# Patient Record
Sex: Female | Born: 1980 | Race: White | Hispanic: Yes | Marital: Married | Smoking: Former smoker
Health system: Southern US, Community
[De-identification: ages and names within clinical notes are randomized; demographics above are authoritative.]

## PROBLEM LIST (undated history)

## (undated) DIAGNOSIS — O24419 Gestational diabetes mellitus in pregnancy, unspecified control: Secondary | ICD-10-CM

## (undated) DIAGNOSIS — R519 Headache, unspecified: Secondary | ICD-10-CM

## (undated) DIAGNOSIS — I10 Essential (primary) hypertension: Secondary | ICD-10-CM

## (undated) DIAGNOSIS — F99 Mental disorder, not otherwise specified: Secondary | ICD-10-CM

## (undated) HISTORY — DX: Essential (primary) hypertension: I10

## (undated) HISTORY — DX: Gestational diabetes mellitus in pregnancy, unspecified control: O24.419

## (undated) HISTORY — DX: Headache, unspecified: R51.9

## (undated) HISTORY — DX: Mental disorder, not otherwise specified: F99

---

## 2009-03-30 DIAGNOSIS — R1904 Left lower quadrant abdominal swelling, mass and lump: Secondary | ICD-10-CM

## 2009-03-30 HISTORY — DX: Left lower quadrant abdominal swelling, mass and lump: R19.04

## 2011-10-13 LAB — OB RESULTS CONSOLE VARICELLA ZOSTER ANTIBODY, IGG
Varicella: IMMUNE
Varicella: IMMUNE
Varicella: IMMUNE

## 2011-10-13 LAB — OB RESULTS CONSOLE RUBELLA ANTIBODY, IGM
Rubella: IMMUNE
Rubella: IMMUNE
Rubella: IMMUNE

## 2011-10-13 LAB — SICKLE CELL SCREEN: Sickle Cell Screen: NORMAL

## 2011-10-15 ENCOUNTER — Emergency Department: Payer: Self-pay | Admitting: Emergency Medicine

## 2011-10-15 LAB — CBC
HCT: 36.8 % (ref 35.0–47.0)
HGB: 13.1 g/dL (ref 12.0–16.0)
MCH: 32.5 pg (ref 26.0–34.0)
MCHC: 35.7 g/dL (ref 32.0–36.0)
RDW: 13.3 % (ref 11.5–14.5)

## 2011-10-15 LAB — PREGNANCY, URINE: Pregnancy Test, Urine: POSITIVE m[IU]/mL

## 2011-10-15 LAB — COMPREHENSIVE METABOLIC PANEL
Albumin: 3.3 g/dL — ABNORMAL LOW (ref 3.4–5.0)
Alkaline Phosphatase: 56 U/L (ref 50–136)
BUN: 10 mg/dL (ref 7–18)
Bilirubin,Total: 0.3 mg/dL (ref 0.2–1.0)
Creatinine: 0.48 mg/dL — ABNORMAL LOW (ref 0.60–1.30)
EGFR (African American): >60
Osmolality: 274 (ref 275–301)
SGOT(AST): 22 U/L (ref 15–37)
Sodium: 138 mmol/L (ref 136–145)

## 2011-10-15 LAB — URINALYSIS, COMPLETE
Bacteria: NONE SEEN
Bilirubin,UR: NEGATIVE
Ketone: NEGATIVE
Leukocyte Esterase: NEGATIVE
Protein: NEGATIVE
RBC,UR: <1 /HPF (ref 0–5)
Specific Gravity: 1.015 (ref 1.003–1.030)
Squamous Epithelial: 1
WBC UR: 2 /HPF (ref 0–5)

## 2011-10-15 LAB — WET PREP, GENITAL

## 2011-10-15 LAB — LIPASE, BLOOD: Lipase: 131 U/L (ref 73–393)

## 2011-10-15 LAB — HCG, QUANTITATIVE, PREGNANCY: Beta Hcg, Quant.: 35390 m[IU]/mL — ABNORMAL HIGH

## 2011-10-16 IMAGING — US US OB < 14 WEEKS
1 series · 14 of 28 positions shown · non-contrast
Comparison: none

REASON FOR EXAM: Abdominal pain, vaginal bleeding
COMMENTS:   May transport without cardiac monitor

PROCEDURE:     US  - US OB LESS THAN 14 WEEKS  - October 16, 2011 [DATE]
RESULT:     Comparison: None.
TECHNIQUE: Multiple grayscale and color Doppler images were obtained of the
pelvis via transabdominal ultrasound. Endovaginal ultrasound was not
performed.

[Series 1: us ob < 14 weeks · 0.21mm/px · 14 of 29 slices shown]
[im 2/29]
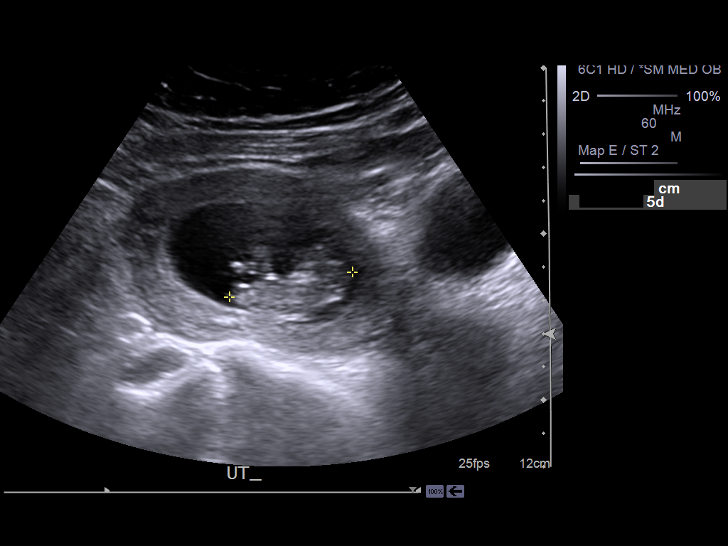
[im 4/29]
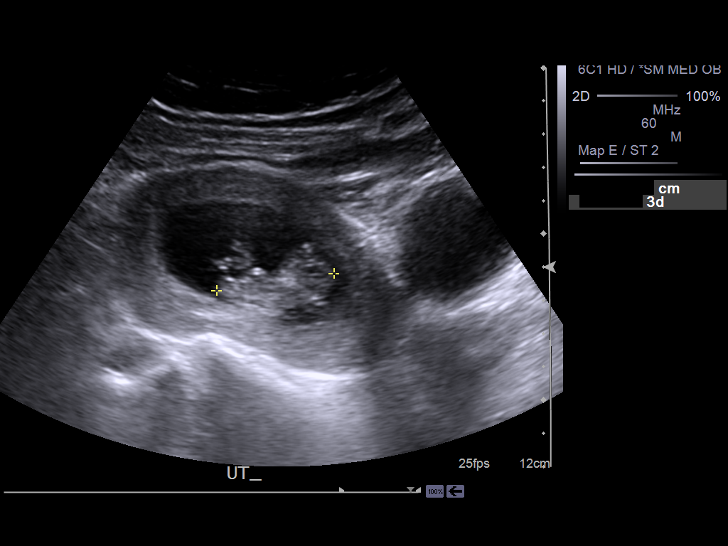
[im 6/29]
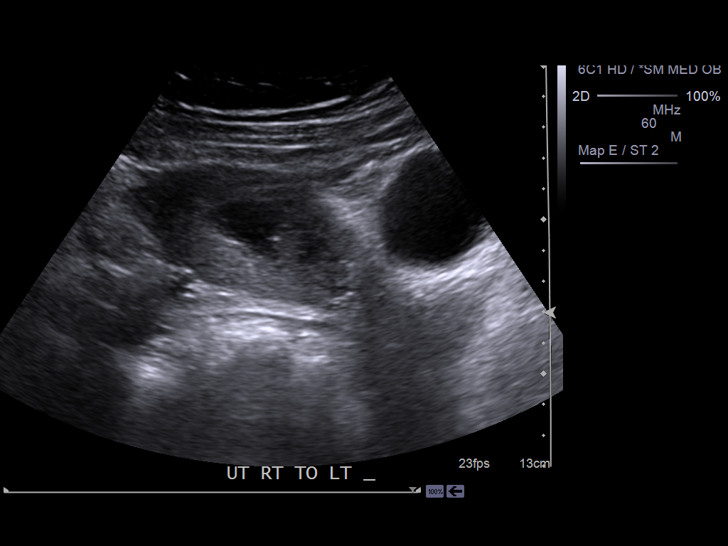
[im 8/29]
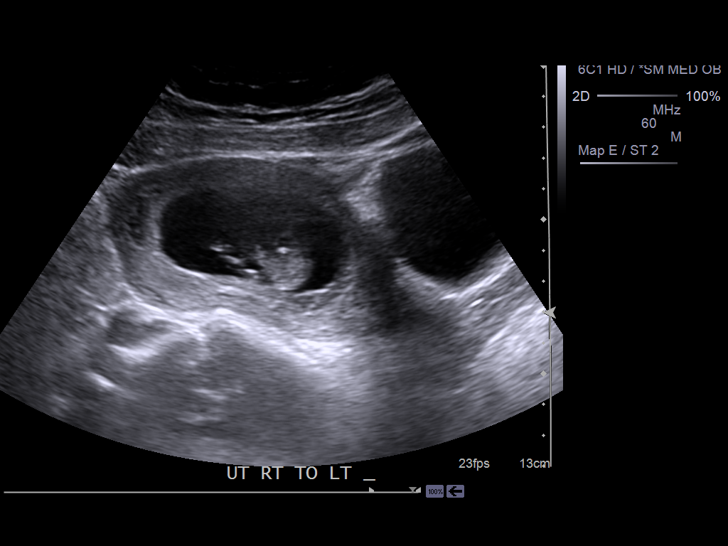
[im 10/29]
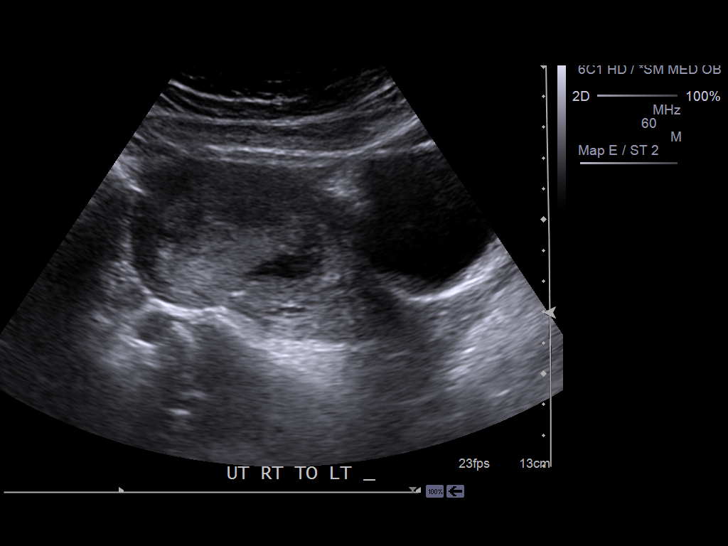
[im 12/29]
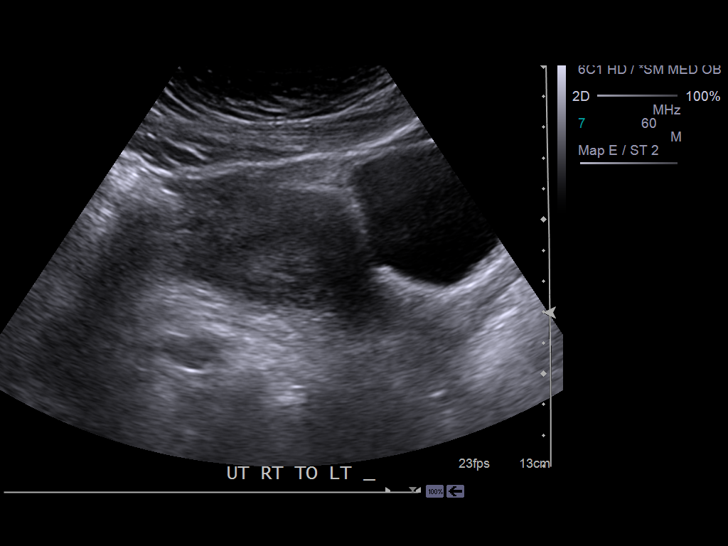
[im 14/29]
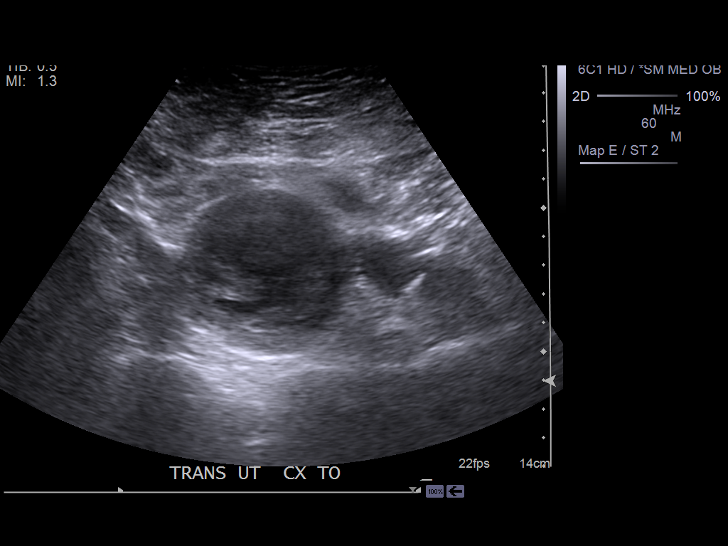
[im 16/29]
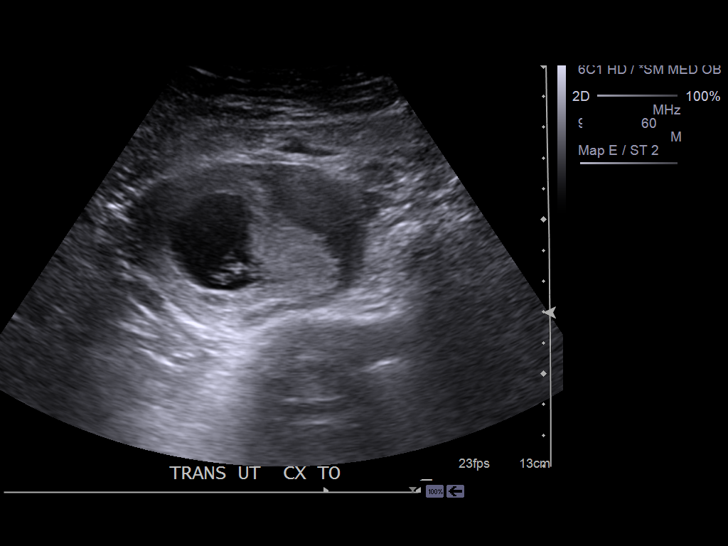
[im 18/29]
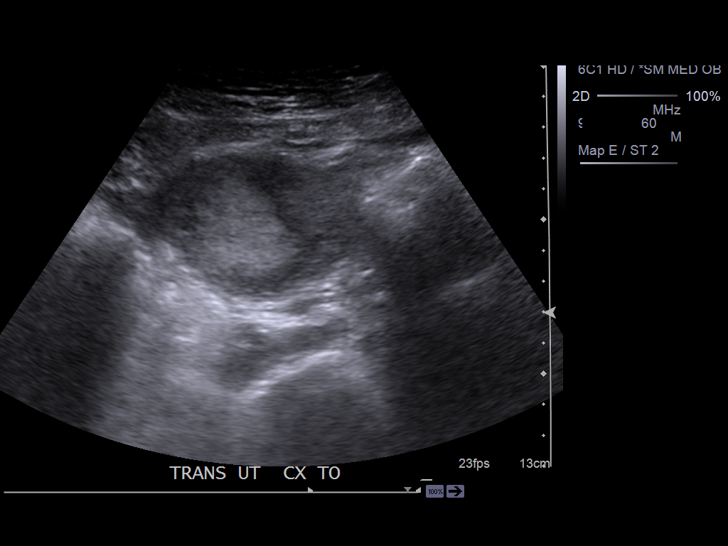
[im 20/29]
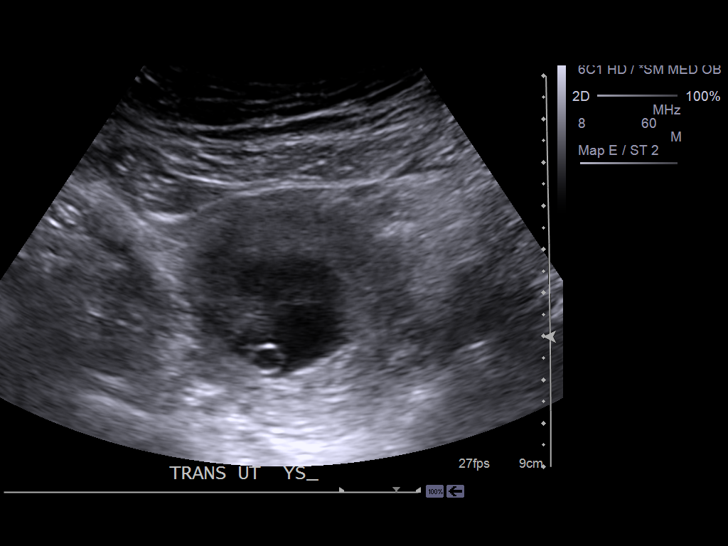
[im 22/29]
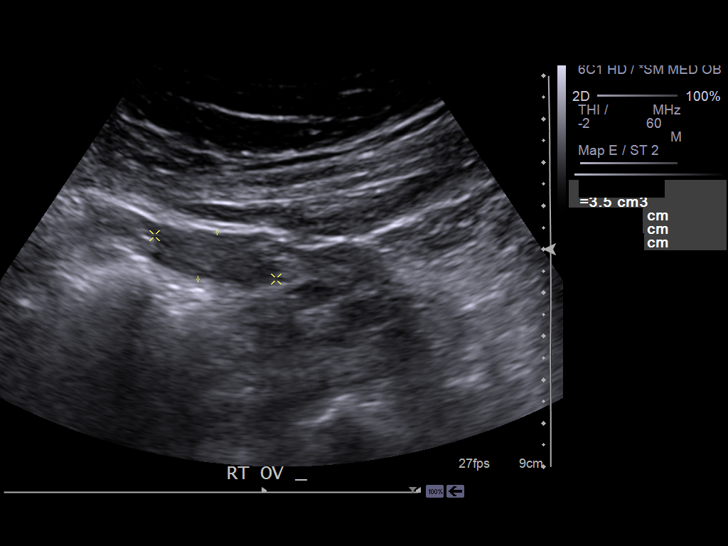
[im 24/29]
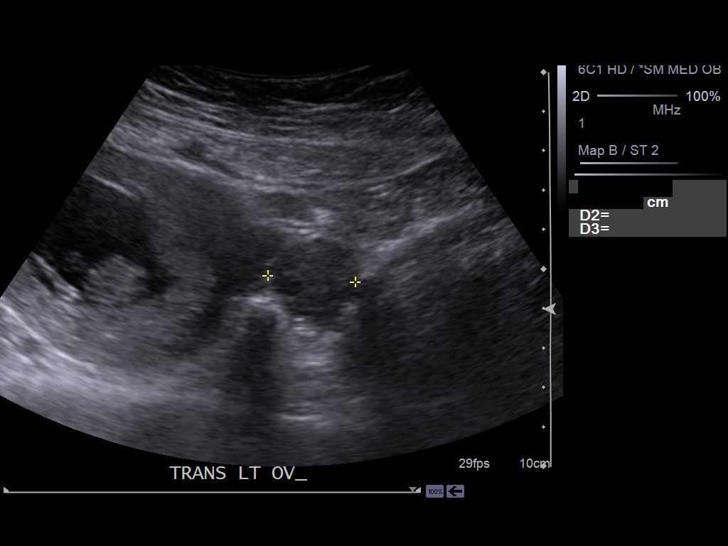
[im 26/29]
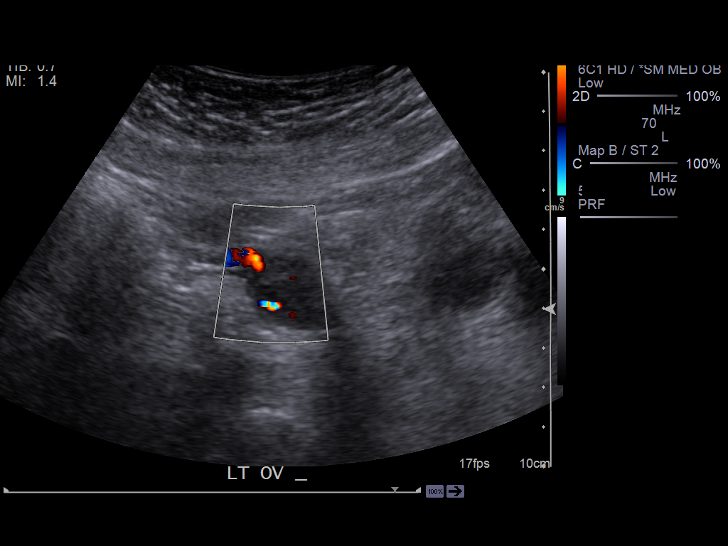
[im 29/29]
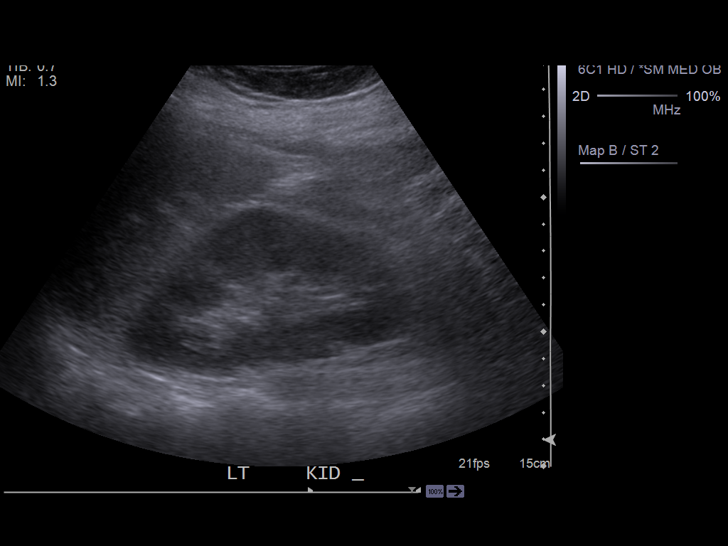

[14 of 28 positions shown; findings below may reference images not displayed]

FINDINGS: There is a single live intrauterine pregnancy with estimated gestational age
of 10 weeks 4 days. The fetal heart rate was 153 beats per minute. A yolk
sac is present.

The right ovary measures 3.0 x 1.9 x 1.2 cm. The left ovary measures 2.6 x
2.2 x 1.8 cm. Color Doppler flow is associated with the bilateral ovaries.
Spectral Doppler imaging was not performed. No adnexal mass identified.
Limited images of the kidneys showed no hydronephrosis.
IMPRESSION: 1. Single live intrauterine pregnancy with estimated gestational age of 10
weeks 4 days.
2. The ovaries are normal in grayscale appearance and there is associated
color Doppler flow. However, if there is continued clinical concern for
ovarian torsion, spectral Doppler imaging could be performed.

## 2012-03-07 ENCOUNTER — Ambulatory Visit: Payer: Self-pay | Admitting: Physician Assistant

## 2012-03-07 IMAGING — US US OB US >=[ID] SNGL FETUS
1 series · 14 of 28 positions shown · non-contrast
Comparison: none

REASON FOR EXAM: size greater than dates
COMMENTS:

PROCEDURE:     US  - US OB GREATER/OR EQUAL TO 2TGBB  - March 07, 2012  [DATE]
RESULT:     Comparison: 10/16/2011
TECHNIQUE: Multiple grayscale and color Doppler images were obtained of the
pelvis.

[Series 1: us ob us >=(id) sngl fetus · 0.35mm/px · 14 of 127 slices shown]
[im 5/127]
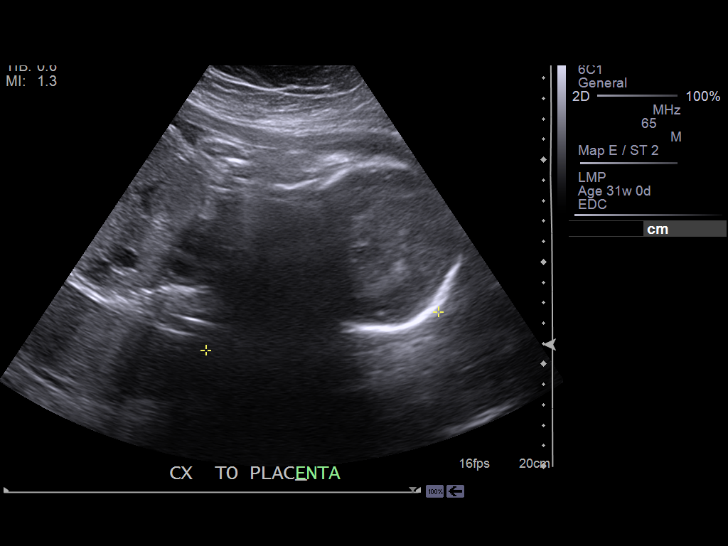
[im 15/127]
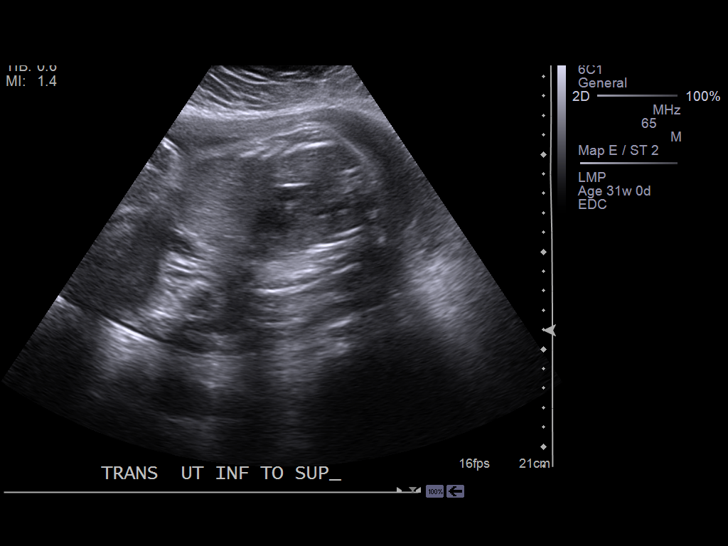
[im 24/127]
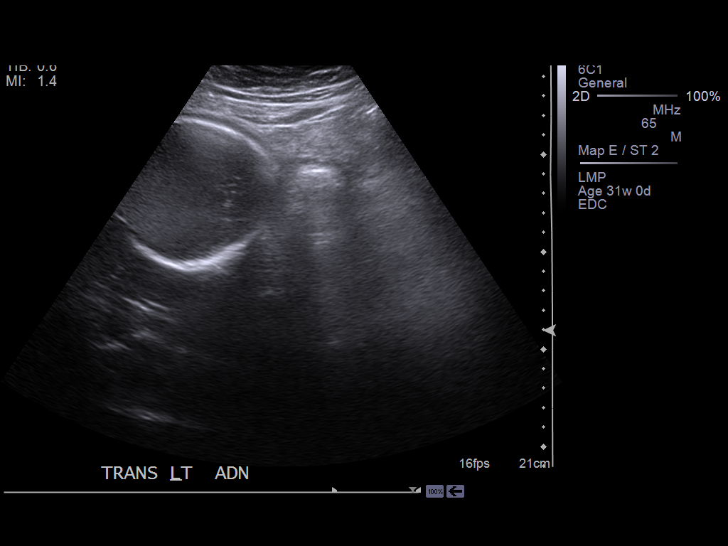
[im 33/127]
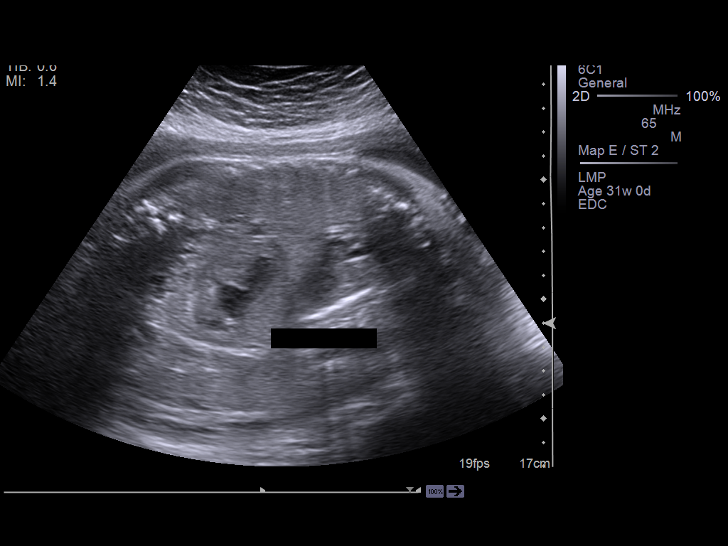
[im 43/127]
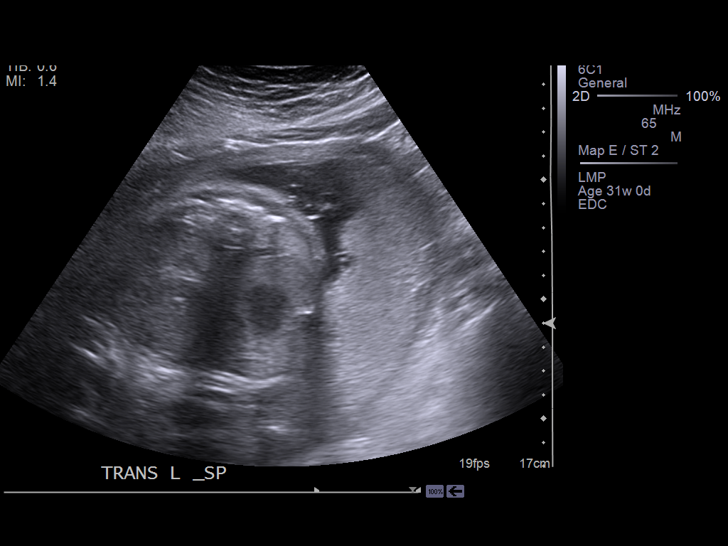
[im 52/127]
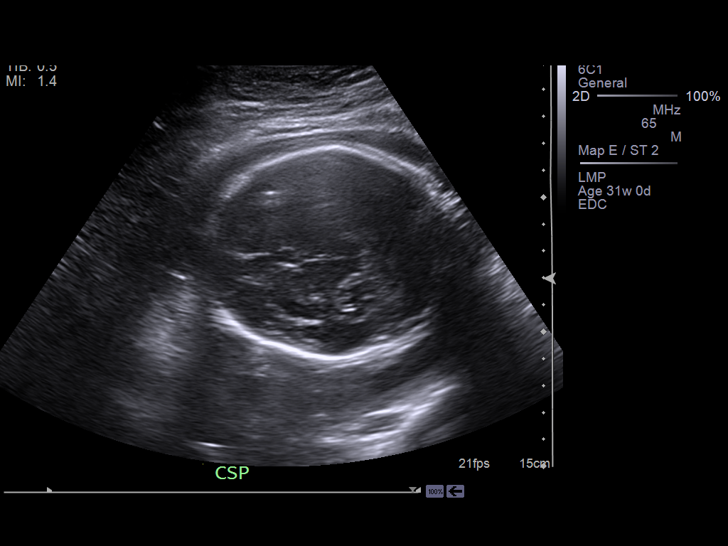
[im 61/127]
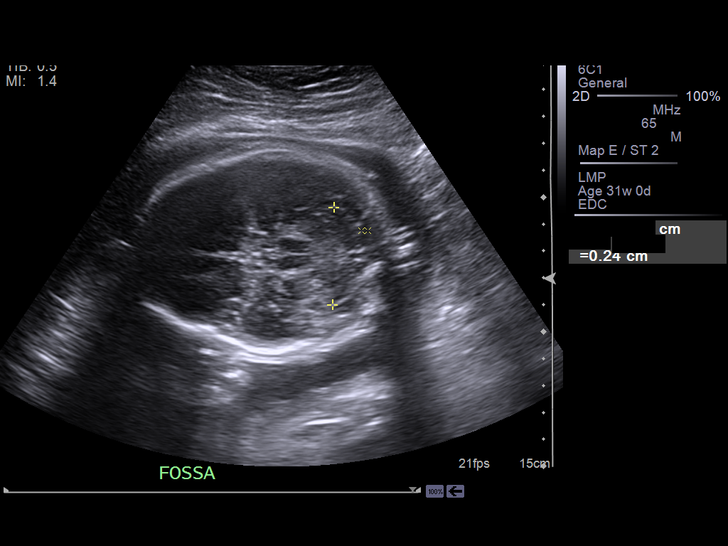
[im 71/127]
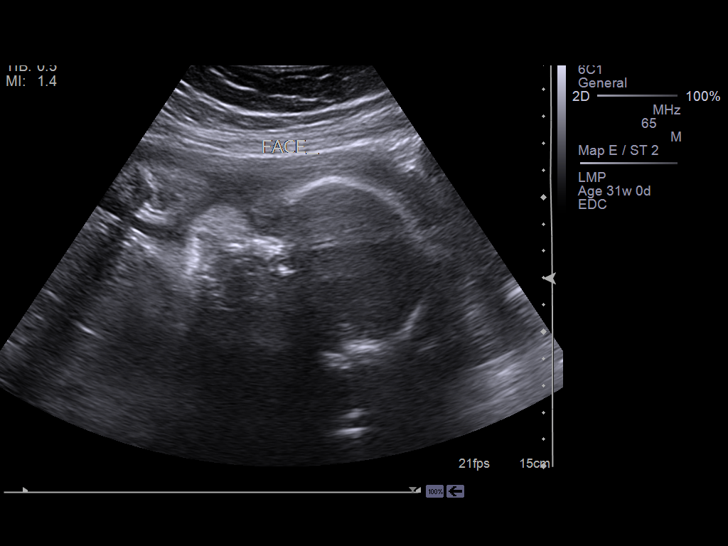
[im 80/127]
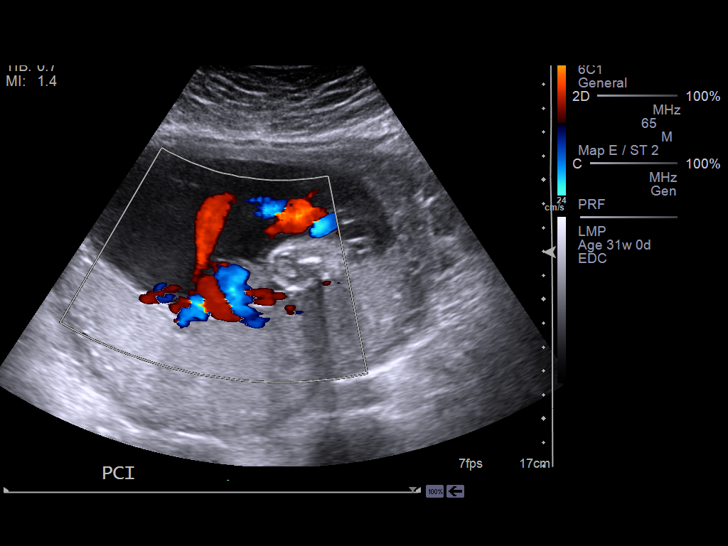
[im 89/127]
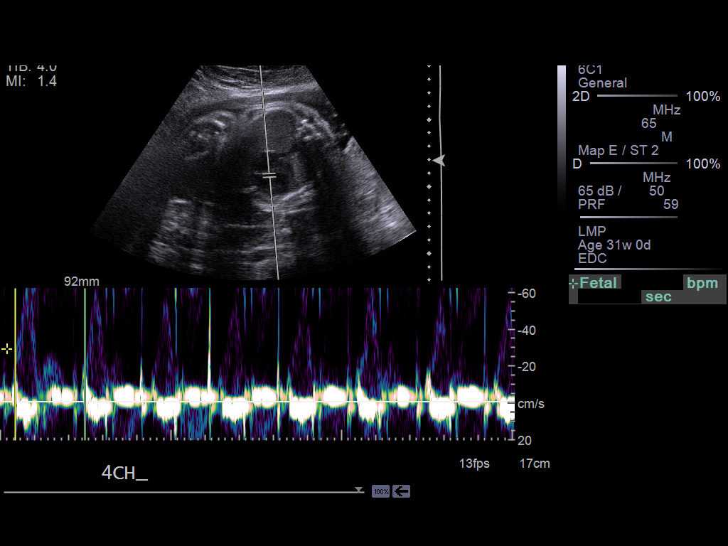
[im 99/127]
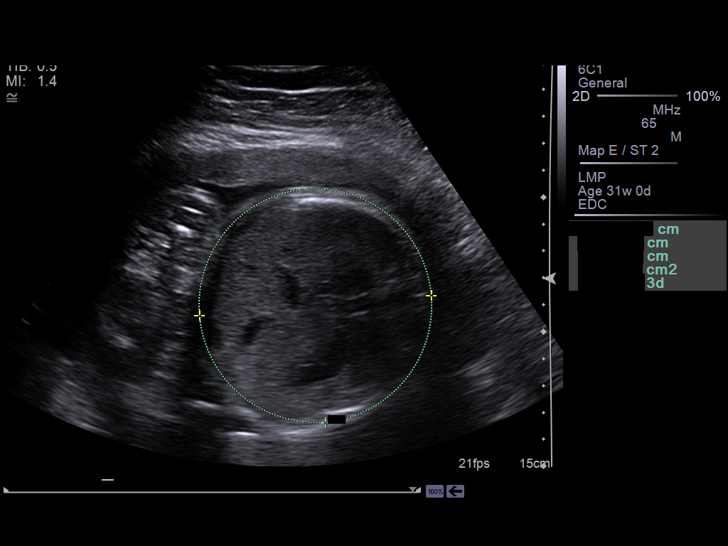
[im 108/127]
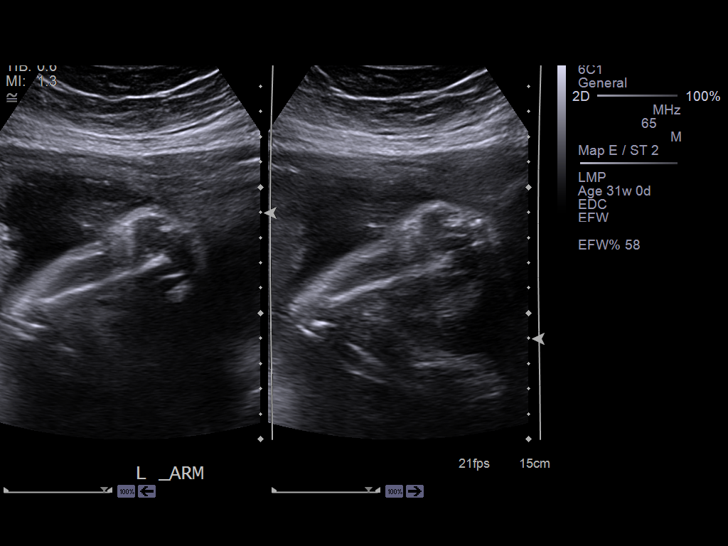
[im 117/127]
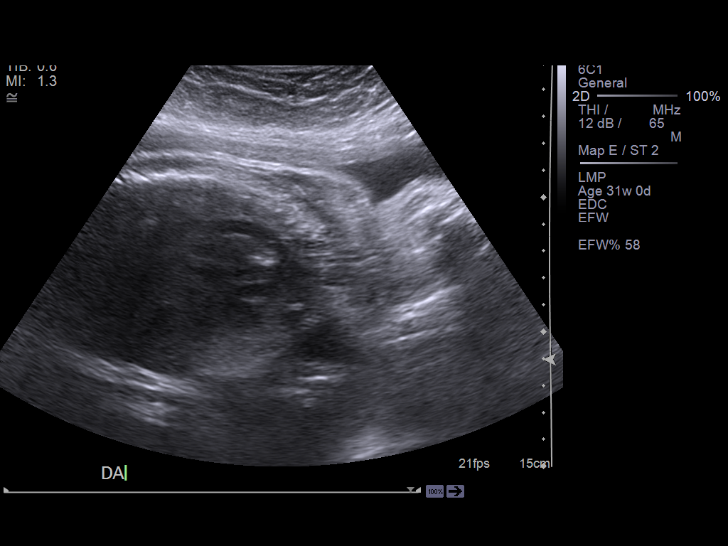
[im 127/127]
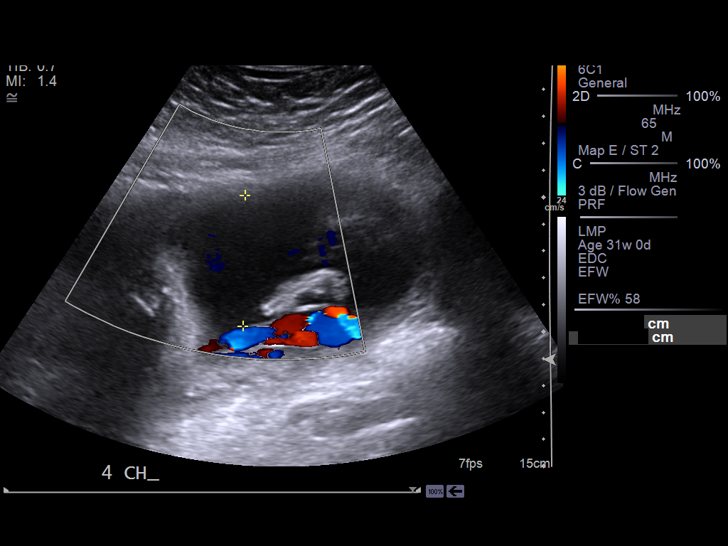

[14 of 28 positions shown; findings below may reference images not displayed]

FINDINGS: There is a single live intrauterine pregnancy with estimated gestational age
of 31 weeks 5 days. The cervix measures 4.4 cm in length and is closed. The
fetus is currently in cephalic position. The fetal heart rate was 147 beats
per minute. The placenta is in a posterior location. No evidence of placenta
previa. Please note, detailed fetal anatomic imaging is limited secondary to
advanced gestational age. There is a four-chamber heart. The amniotic fluid
index is 13.4 cm, which is within normal limits. Estimated fetal weight is 3
pounds 15 ounces + / - 9 ounces. The estimated fetal weight percentage is 58%.
IMPRESSION: Single live intrauterine pregnancy with estimated gestational age of 31
weeks 5 days.

[REDACTED]

## 2012-04-30 ENCOUNTER — Ambulatory Visit: Payer: Self-pay | Admitting: Family Medicine

## 2012-04-30 IMAGING — US US OB LIMITED
1 series · 12 of 12 positions shown · non-contrast
Comparison: none

REASON FOR EXAM: vertex   positioning
COMMENTS:

[Series 1: us ob limited · 0.26mm/px · 12 of 12 slices shown]
[im 1/12]
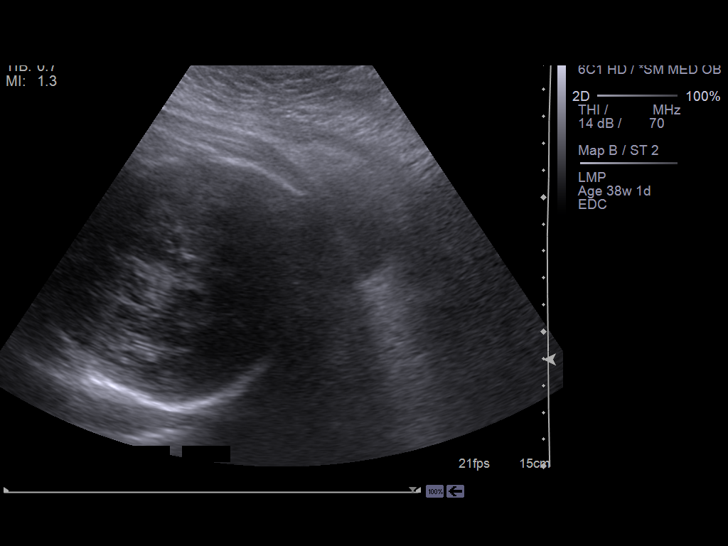
[im 2/12]
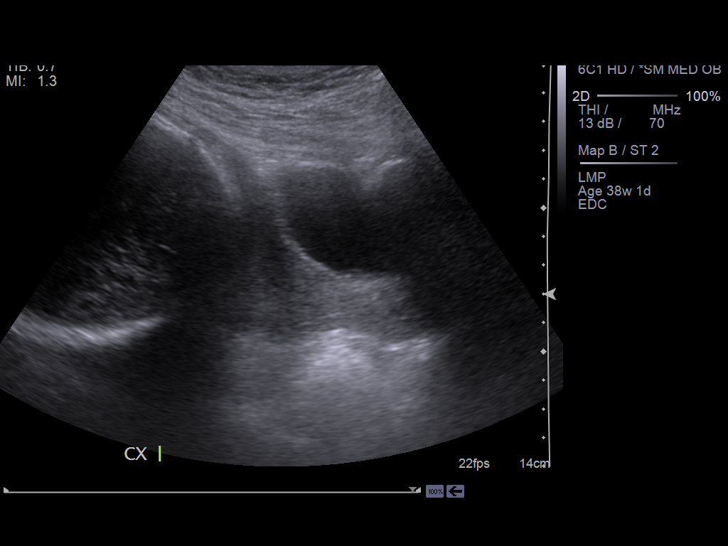
[im 3/12]
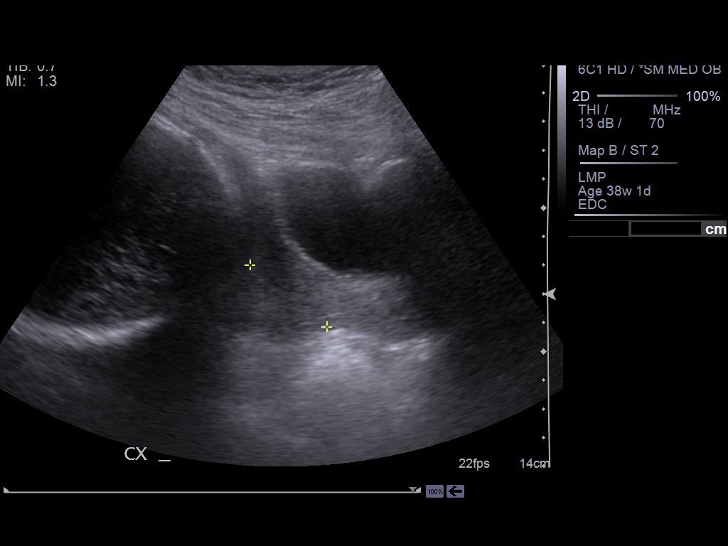
[im 4/12]
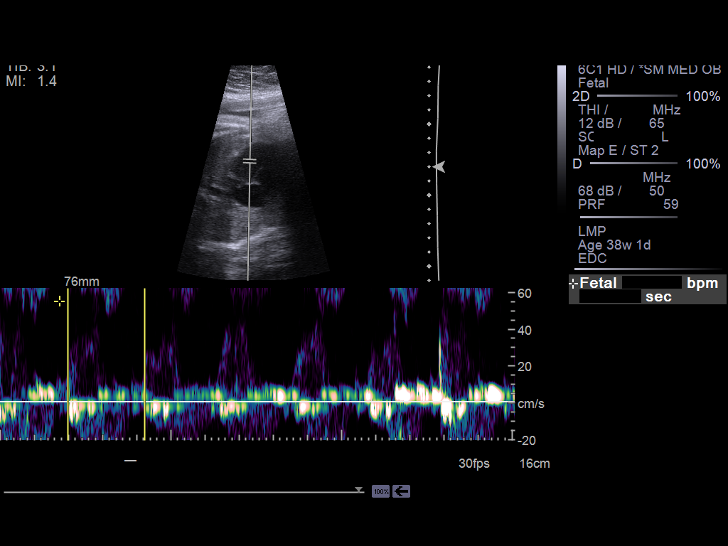
[im 5/12]
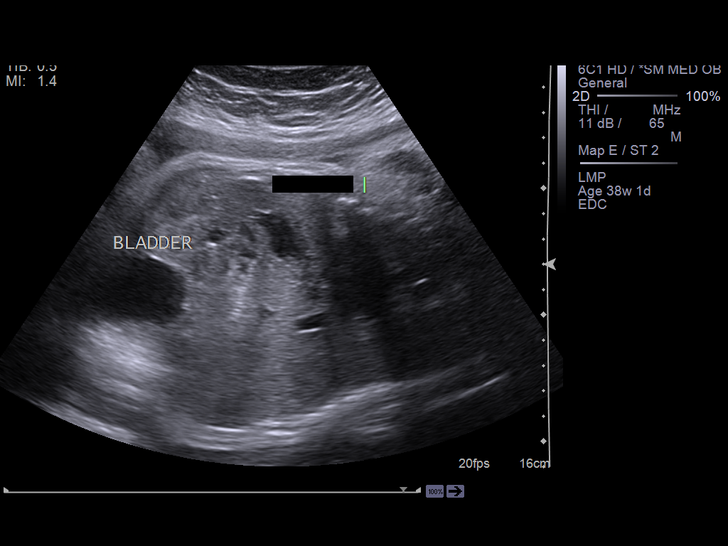
[im 6/12]
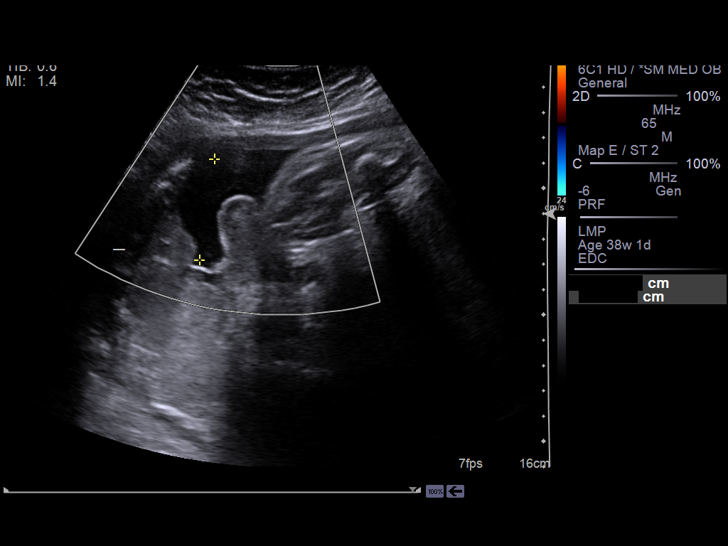
[im 7/12]
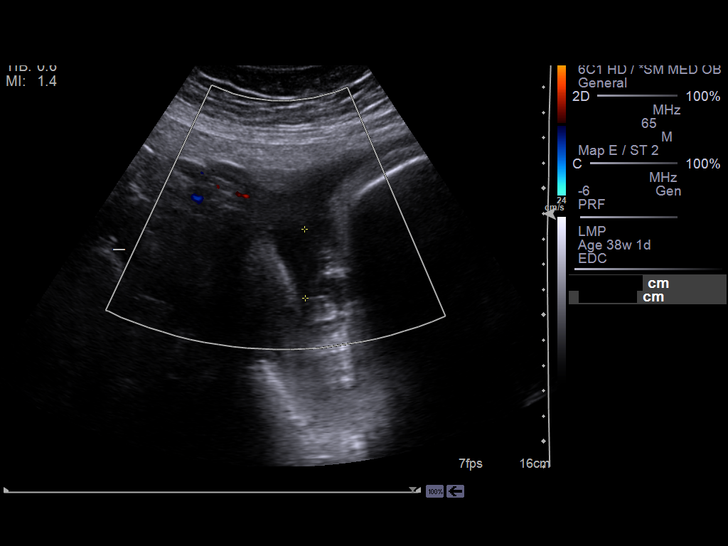
[im 8/12]
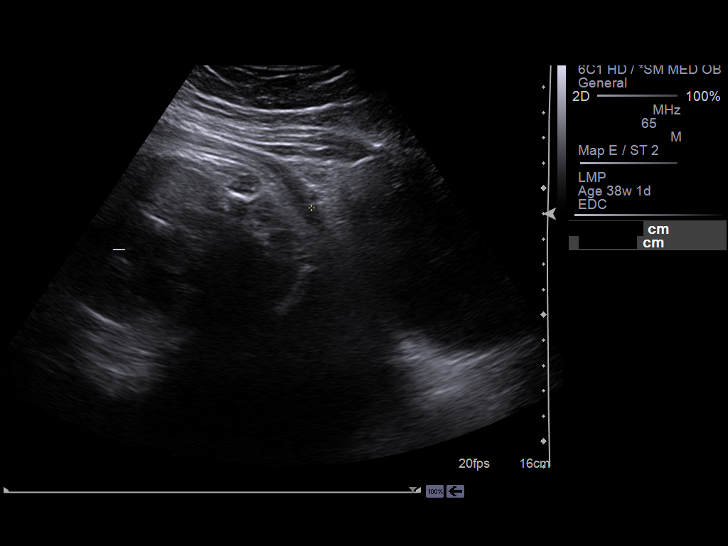
[im 9/12]
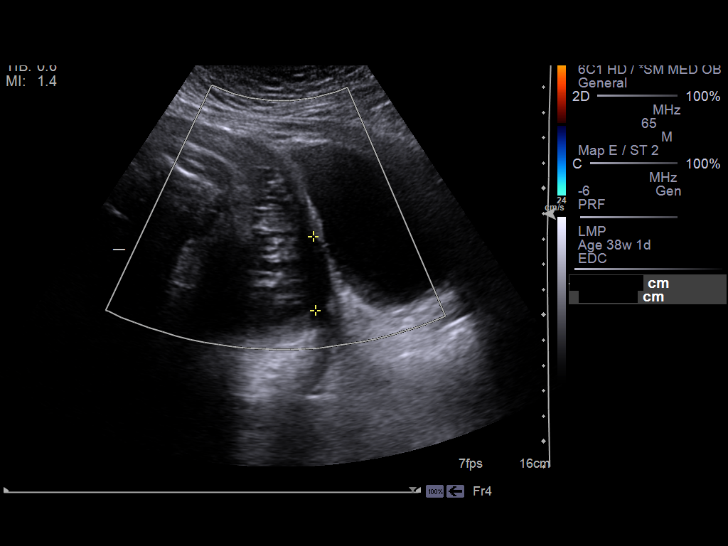
[im 10/12]
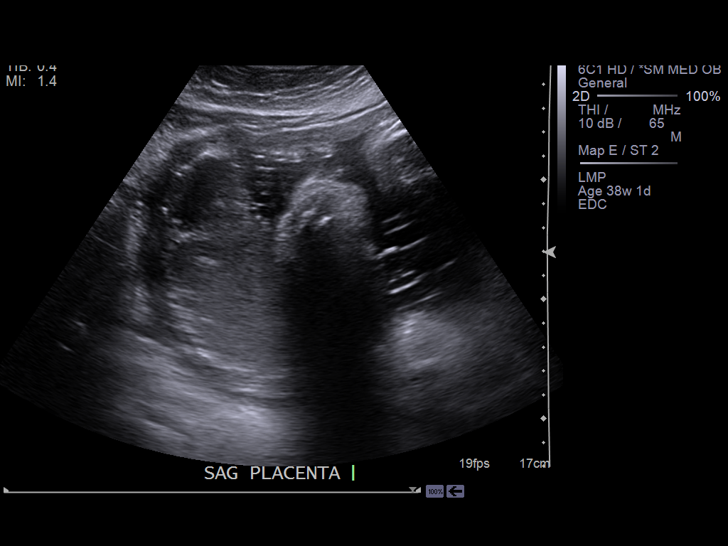
[im 11/12]
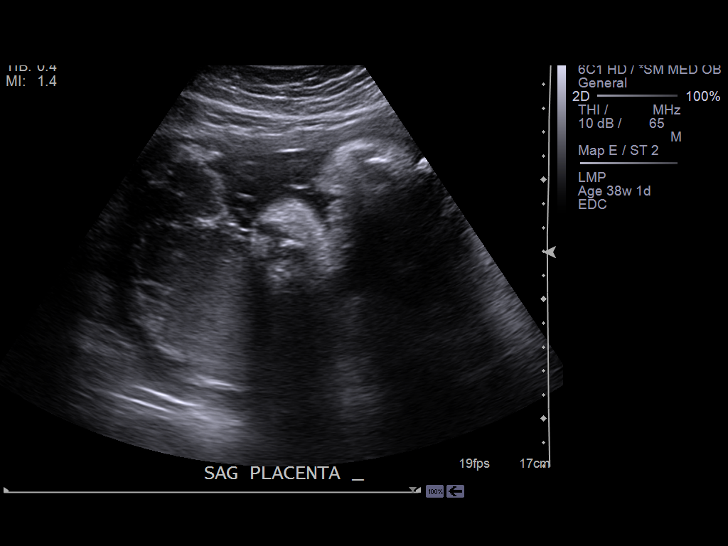
[im 12/12]
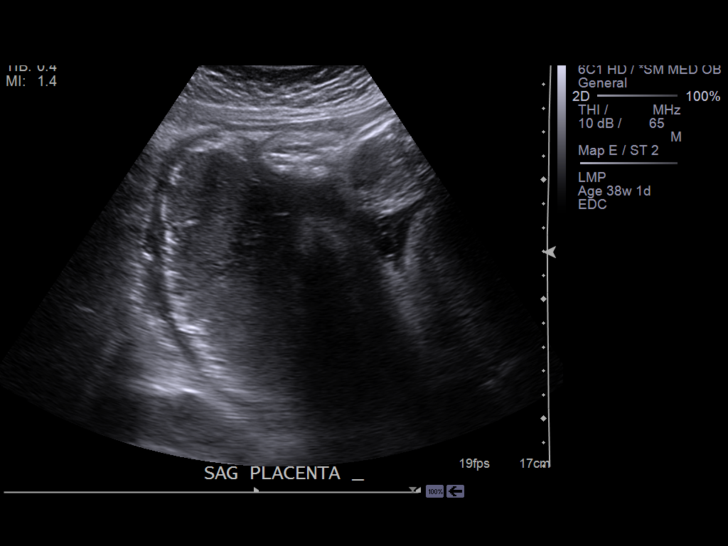

[12 of 12 positions shown; findings below may reference images not displayed]

PROCEDURE:     US  - US LIMITED OB  - April 30, 2012  [DATE]

RESULT:     The length of the cervix is 3.43 cm. There is a single
intrauterine fetus with a cephalic presentation and a longitudinal lie.
Fetal heart rate is 133 beats per minute. The AFI is 9.7 cm. The placenta is
fundal and right lateral. There is no marginal placenta or evidence of
placenta previa.
IMPRESSION: Limited study. AFI is 9.7 cm. Fetal heart rate is 133 beats
per minute. There is a cephalic presentation without evidence of placenta
previa or marginal placenta.

[REDACTED]

## 2012-05-15 ENCOUNTER — Observation Stay: Payer: Self-pay | Admitting: Obstetrics and Gynecology

## 2012-05-15 LAB — PIH PROFILE
Calcium, Total: 8.3 mg/dL — ABNORMAL LOW (ref 8.5–10.1)
Co2: 21 mmol/L (ref 21–32)
EGFR (African American): >60
EGFR (Non-African Amer.): >60
HCT: 34.6 % — ABNORMAL LOW (ref 35.0–47.0)
HGB: 11.8 g/dL — ABNORMAL LOW (ref 12.0–16.0)
MCH: 31.4 pg (ref 26.0–34.0)
MCV: 92 fL (ref 80–100)
Osmolality: 273 (ref 275–301)
Platelet: 190 10*3/uL (ref 150–440)
Potassium: 3.8 mmol/L (ref 3.5–5.1)
RBC: 3.77 10*6/uL — ABNORMAL LOW (ref 3.80–5.20)
RDW: 13.9 % (ref 11.5–14.5)
SGOT(AST): 31 U/L (ref 15–37)
Sodium: 138 mmol/L (ref 136–145)
Uric Acid: 5.5 mg/dL (ref 2.6–6.0)
WBC: 7.3 10*3/uL (ref 3.6–11.0)

## 2012-05-15 LAB — PROTEIN / CREATININE RATIO, URINE
Protein, Random Urine: 27 mg/dL — ABNORMAL HIGH (ref 0–12)
Protein/Creat. Ratio: 423 mg/gCREAT — ABNORMAL HIGH (ref 0–200)

## 2012-05-21 ENCOUNTER — Inpatient Hospital Stay: Payer: Self-pay

## 2012-05-21 LAB — CBC WITH DIFFERENTIAL/PLATELET
Eosinophil #: 0.1 10*3/uL (ref 0.0–0.7)
Lymphocyte #: 1.8 10*3/uL (ref 1.0–3.6)
MCH: 35.4 pg — ABNORMAL HIGH (ref 26.0–34.0)
MCV: 99 fL (ref 80–100)
Monocyte #: 0.4 x10 3/mm (ref 0.2–0.9)
Monocyte %: 5.5 %
Neutrophil %: 69.4 %
Platelet: 210 10*3/uL (ref 150–440)
RBC: 3.56 10*6/uL — ABNORMAL LOW (ref 3.80–5.20)

## 2012-05-21 LAB — PROTEIN / CREATININE RATIO, URINE
Creatinine, Urine: 42 mg/dL (ref 30.0–125.0)
Protein, Random Urine: 49 mg/dL — ABNORMAL HIGH (ref 0–12)

## 2012-05-23 LAB — PATHOLOGY REPORT

## 2013-10-06 ENCOUNTER — Emergency Department: Payer: Self-pay | Admitting: Emergency Medicine

## 2013-10-06 LAB — CBC WITH DIFFERENTIAL/PLATELET
Basophil #: 0 10*3/uL (ref 0.0–0.1)
Basophil %: 0.4 %
EOS ABS: 0.1 10*3/uL (ref 0.0–0.7)
Eosinophil %: 1.2 %
HCT: 41.6 % (ref 35.0–47.0)
HGB: 13.9 g/dL (ref 12.0–16.0)
LYMPHS PCT: 39.1 %
Lymphocyte #: 2.6 10*3/uL (ref 1.0–3.6)
MCH: 30.8 pg (ref 26.0–34.0)
MCHC: 33.5 g/dL (ref 32.0–36.0)
MCV: 92 fL (ref 80–100)
MONOS PCT: 5.5 %
Monocyte #: 0.4 x10 3/mm (ref 0.2–0.9)
NEUTROS ABS: 3.5 10*3/uL (ref 1.4–6.5)
Neutrophil %: 53.8 %
PLATELETS: 335 10*3/uL (ref 150–440)
RBC: 4.53 10*6/uL (ref 3.80–5.20)
RDW: 12.9 % (ref 11.5–14.5)
WBC: 6.5 10*3/uL (ref 3.6–11.0)

## 2013-10-06 LAB — URINALYSIS, COMPLETE
BACTERIA: NONE SEEN
BILIRUBIN, UR: NEGATIVE
Glucose,UR: NEGATIVE mg/dL (ref 0–75)
Ketone: NEGATIVE
Leukocyte Esterase: NEGATIVE
Nitrite: NEGATIVE
Ph: 5 (ref 4.5–8.0)
Protein: 30
RBC,UR: 2627 /HPF (ref 0–5)
SPECIFIC GRAVITY: 1.017 (ref 1.003–1.030)
Squamous Epithelial: NONE SEEN
WBC UR: 98 /HPF (ref 0–5)

## 2013-10-06 LAB — COMPREHENSIVE METABOLIC PANEL
AST: 44 U/L — AB (ref 15–37)
Albumin: 3.7 g/dL (ref 3.4–5.0)
Alkaline Phosphatase: 106 U/L
Anion Gap: 8 (ref 7–16)
BUN: 15 mg/dL (ref 7–18)
Bilirubin,Total: 0.4 mg/dL (ref 0.2–1.0)
CO2: 23 mmol/L (ref 21–32)
CREATININE: 0.66 mg/dL (ref 0.60–1.30)
Calcium, Total: 8.8 mg/dL (ref 8.5–10.1)
Chloride: 107 mmol/L (ref 98–107)
EGFR (Non-African Amer.): >60
Glucose: 98 mg/dL (ref 65–99)
OSMOLALITY: 276 (ref 275–301)
Potassium: 4 mmol/L (ref 3.5–5.1)
SGPT (ALT): 60 U/L
SODIUM: 138 mmol/L (ref 136–145)
Total Protein: 7.8 g/dL (ref 6.4–8.2)

## 2013-10-06 LAB — GC/CHLAMYDIA PROBE AMP

## 2013-10-06 LAB — HCG, QUANTITATIVE, PREGNANCY: Beta Hcg, Quant.: <1 m[IU]/mL — ABNORMAL LOW

## 2013-10-06 LAB — WET PREP, GENITAL

## 2014-05-23 NOTE — Op Note (Signed)
PATIENT NAME:  Tara Chavez, Tara Chavez MR#:  [id number] DATE OF BIRTH:  04/02/1980  DATE OF PROCEDURE:  05/21/2012  PREOPERATIVE DIAGNOSES:  1. Active phase arrest.  2. Nonreassuring fetal monitoring.  3. Meconium staining.   POSTOPERATIVE DIAGNOSES: 1. Active phase arrest.  2. Nonreassuring fetal monitoring.  3. Meconium staining.    PROCEDURE: Primary low transverse cesarean section.   ANESTHESIA: Surgical dosing of continuous lumbar epidural.   SURGEON: Suzy Bouchardhomas J. Arris Meyn, MD  FIRST ASSISTANT: Cydney OkAngela R. Lugiano, CNM   INDICATIONS: A 34 year old gravida 1, para 0 patient, dilated to 8 cm and did not progress past 8 cm for 3+ hours. The fetus developed recurrent late decelerations with contractions, and meconium was staining was noted.   PROCEDURE: After adequate surgical dosing of continuous lumbar epidural, the patient was prepped and draped in normal sterile fashion. The patient did receive 2 grams IV Ancef prior to commencement of the case. A Pfannenstiel incision was made 2 fingerbreadths above the symphysis pubis. Sharp dissection was used to identify the fascia. The fascia was opened in the midline, and the superior aspect of the fascia was grasped with the Kocher clamps and the recti muscles dissected free. The inferior aspect of the fascia was grasped with Kocher clamps, and pyramidalis muscle was dissected free. Entry into the peritoneal cavity was accomplished sharply. The vesicouterine peritoneal fold was identified, and a bladder flap was created, and the bladder was reflected inferiorly. A low transverse uterine incision was made. Upon entry into the endometrial cavity, thick pea soup meconium was noted. The fetal head was brought to the incision, and head and body were delivered without difficulty. Spontaneous gasp on the abdomen while the cord was being clamped. The infant was passed to nursery staff, assigned Apgar scores of 9 and 9. Placenta was manually delivered. The  uterus was exteriorized and wiped clean with laparotomy tape. The uterine incision was then closed with 1 chromic suture in a running locking fashion with good approximation of the edges. Good hemostasis was noted. Uterus was a bit floppy. Intravenous Pitocin was increased to 40 units for 1000 mL bag, and the patient did receive 0.2 mg of Methergine intramuscular. The posterior cul-de-sac was irrigated and suctioned, and the fallopian tubes and ovaries appeared normal. The uterus was then placed back into the abdominal cavity. The uterine incision again appeared hemostatic. The paracolic gutters were wiped clean with laparotomy tape. On-Q pump catheters were then advanced from the infraumbilical incision to a subfascial position, and fascia was closed overtop this without difficulty. Subcutaneous tissues were irrigated and bovied for hemostasis, and the skin was reapproximated with staples. The On-Q pump was then secured with Steri-Strips, and each catheter was loaded with 5 mL of 0.5% Marcaine. There were no complications.   ESTIMATED BLOOD LOSS: 800 mL.   INTRAOPERATIVE FLUIDS: 600 mL.   The patient tolerated the procedure well and was taken to the recovery room in good condition. Female delivered at 1904, and Apgars were 9 and 9.   ____________________________ Suzy Bouchardhomas J. Kinzlie Harney, MD tjs:OSi D: 05/21/2012 19:36:45 ET T: 05/22/2012 05:57:12 ET JOB#: [id number]  cc: Suzy Bouchardhomas J. Ifeanyichukwu Wickham, MD, <Dictator> Suzy BouchardHOMAS J Remmy Crass MD ELECTRONICALLY SIGNED 05/22/2012 20:29

## 2014-06-10 NOTE — H&P (Signed)
L&D Evaluation:  History:  HPI 34 y/o G1 @ 41wks EDC 05/13/12 arrives with c/o regular contractions this am, denies leaking fluid or vaginal bleeding, baby is active. GBS negative. Care @ [facility].   Presents with abdominal pain   Patient's Medical History No Chronic Illness   Patient's Surgical History none   Medications Pre Natal Vitamins   Allergies NKDA   Social History none   Family History Non-Contributory   ROS:  ROS All systems were reviewed.  HEENT, CNS, GI, GU, Respiratory, CV, Renal and Musculoskeletal systems were found to be normal.   Exam:  Vital Signs stable   Urine Protein not completed   General no apparent distress   Mental Status clear   Chest clear   Heart normal sinus rhythm   Abdomen gravid, non-tender   Estimated Fetal Weight Average for gestational age   Fetal Position vtx   Fundal Height term   Back no CVAT   Edema no edema   Reflexes 1+   Clonus positive   Pelvic no external lesions, 5cm upon admission progressed to 6-7cm 50% vtx @ -2 BBOW nl show   Mebranes Intact   FHT normal rate with no decels, baseline 130's 140's avg variability with accels   FHT Description 144   Ucx regular, q 2/3 mins 60sec strong   Skin dry   Lymph no lymphadenopathy   Impression:  Impression active labor   Plan:  Plan monitor contractions and for cervical change   Comments Admitted, explained plan of care, what to expect with first baby, questions answered. Discussed pain relief options, breathing thru uc's well, declines for now. FOB @ bedside supportive.   Electronic Signatures: Albertina ParrLugiano, Edwin Baines B (CNM)  (Signed 21-Apr-14 09:28)  Authored: L&D Evaluation   Last Updated: 21-Apr-14 09:28 by Albertina ParrLugiano, Moishe Schellenberg B (CNM)

## 2017-03-06 LAB — HM HIV SCREENING LAB: HM HIV Screening: NEGATIVE

## 2017-03-06 LAB — HM PAP SMEAR: HM Pap smear: NEGATIVE

## 2018-03-28 LAB — HEMOGLOBIN A1C: Hemoglobin A1C: 5.6

## 2018-09-10 ENCOUNTER — Other Ambulatory Visit: Payer: Self-pay

## 2018-09-10 ENCOUNTER — Ambulatory Visit (LOCAL_COMMUNITY_HEALTH_CENTER): Payer: Self-pay

## 2018-09-10 VITALS — BP 111/67 | Ht 59.0 in | Wt 160.5 lb

## 2018-09-10 DIAGNOSIS — Z3009 Encounter for other general counseling and advice on contraception: Secondary | ICD-10-CM

## 2018-09-10 DIAGNOSIS — Z30011 Encounter for initial prescription of contraceptive pills: Secondary | ICD-10-CM

## 2018-09-10 MED ORDER — MULTI-VITAMIN/MINERALS PO TABS: 1.0000 | ORAL_TABLET | Freq: Every day | ORAL | 0 refills | Status: DC

## 2018-09-10 MED ORDER — NORETHIN ACE-ETH ESTRAD-FE 1.5-30 MG-MCG PO TABS: 1.0000 | ORAL_TABLET | Freq: Every day | ORAL | 11 refills | Status: DC

## 2018-09-10 NOTE — Progress Notes (Signed)
Client presents for additional ocps as only able to receive 6 packs of pills at 03/28/2018 physical due to short expiration date. Per 03/28/2018 order of Antoine Primas PA, 6 packs of Microgestin FE 1/20 28 day to take one by mouth QD at the same time QD dispensed to client today. Client aware may receive one additional pack of pills when this 6 is completed (to complete original order for 13 packs of ocps). Client verbalized understanding. Shona Needles, RN

## 2019-02-25 ENCOUNTER — Other Ambulatory Visit: Payer: Self-pay | Admitting: Physician Assistant

## 2019-02-25 DIAGNOSIS — Z3041 Encounter for surveillance of contraceptive pills: Secondary | ICD-10-CM

## 2019-02-25 MED ORDER — NORETHINDRONE ACET-ETHINYL EST 1-20 MG-MCG PO TABS: 1.0000 | ORAL_TABLET | Freq: Every day | ORAL | 0 refills | Status: DC

## 2019-02-25 NOTE — Progress Notes (Signed)
Per chart, last RP was 03/2018.  CBE now recommended annually, Pap due 2023.  On chart, patient taking microgestin.  Provided that patient desires to continue with OCs and BP is normal, may continue with OCs.

## 2019-02-26 ENCOUNTER — Other Ambulatory Visit: Payer: Self-pay | Admitting: Family Medicine

## 2019-02-26 ENCOUNTER — Ambulatory Visit (LOCAL_COMMUNITY_HEALTH_CENTER): Payer: Self-pay

## 2019-02-26 ENCOUNTER — Other Ambulatory Visit: Payer: Self-pay

## 2019-02-26 VITALS — BP 99/62 | Ht 59.0 in | Wt 160.0 lb

## 2019-02-26 DIAGNOSIS — Z3009 Encounter for other general counseling and advice on contraception: Secondary | ICD-10-CM

## 2019-02-26 DIAGNOSIS — Z3041 Encounter for surveillance of contraceptive pills: Secondary | ICD-10-CM

## 2019-02-26 MED ORDER — NORETHIN ACE-ETH ESTRAD-FE 1-20 MG-MCG PO TABS: 1.0000 | ORAL_TABLET | Freq: Every day | ORAL | 0 refills | Status: DC

## 2019-02-26 NOTE — Progress Notes (Signed)
No problems with current OCP. #13 packs of Microgestin Fe 1/20 dispensed out of [facility] stock per Sadie Haber, [place] order dated 02/25/2019.

## 2019-03-04 ENCOUNTER — Encounter: Payer: Self-pay | Admitting: Family Medicine

## 2019-03-04 ENCOUNTER — Other Ambulatory Visit: Payer: Self-pay

## 2019-03-04 ENCOUNTER — Ambulatory Visit: Payer: Self-pay | Admitting: Family Medicine

## 2019-03-04 DIAGNOSIS — Z113 Encounter for screening for infections with a predominantly sexual mode of transmission: Secondary | ICD-10-CM

## 2019-03-04 DIAGNOSIS — B379 Candidiasis, unspecified: Secondary | ICD-10-CM

## 2019-03-04 LAB — WET PREP FOR TRICH, YEAST, CLUE
Trichomonas Exam: NEGATIVE
Yeast Exam: NEGATIVE

## 2019-03-04 MED ORDER — CLOTRIMAZOLE 1 % VA CREA: 1.0000 | TOPICAL_CREAM | Freq: Every day | VAGINAL | 0 refills | Status: DC

## 2019-03-04 NOTE — Progress Notes (Signed)
[facility] Department STI clinic/screening visit  Subjective:  Tara Chavez is a 39 y.o. female being seen today for  Chief Complaint  Patient presents with  . SEXUALLY TRANSMITTED DISEASE     The patient reports they do have symptoms. Patient reports that they do not desire a pregnancy in the next year. They reported they are not interested in discussing contraception today.   Patient has the following medical conditions:  There are no problems to display for this patient.   HPI  Pt reports she has had vaginal itching/irriation/burning x1 month. Also has had LLQ abd pain xsame.   See flowsheet for further details and programmatic requirements.    Patient's last menstrual period was 02/07/2019. Last sex: 1/30 BCM: OCP Desires EC? n/a  No components found for: HCV  The following portions of the patient's history were reviewed and updated as appropriate: allergies, current medications, past medical history, past social history, past surgical history and problem list.  Objective:  There were no vitals filed for this visit.  Physical Exam Vitals and nursing note reviewed.  Constitutional:      Appearance: Normal appearance.  HENT:     Head: Normocephalic and atraumatic.     Mouth/Throat:     Mouth: Mucous membranes are moist.     Pharynx: Oropharynx is clear. No oropharyngeal exudate or posterior oropharyngeal erythema.  Pulmonary:     Effort: Pulmonary effort is normal.  Abdominal:     General: Abdomen is flat.     Palpations: There is no mass.     Tenderness: There is no abdominal tenderness. There is no rebound.  Genitourinary:    General: Normal vulva.     Exam position: Lithotomy position.     Pubic Area: No rash or pubic lice.      Labia:        Right: No rash or lesion.        Left: No rash or lesion.      Vagina: Vaginal discharge (white, curdlike, ph<4.5) present. No erythema, bleeding or lesions.     Cervix: No cervical motion  tenderness, discharge, friability, lesion or erythema.     Uterus: Normal.      Adnexa: Right adnexa normal and left adnexa normal.     Rectum: Normal.  Lymphadenopathy:     Head:     Right side of head: No preauricular or posterior auricular adenopathy.     Left side of head: No preauricular or posterior auricular adenopathy.     Cervical: No cervical adenopathy.     Upper Body:     Right upper body: No supraclavicular or axillary adenopathy.     Left upper body: No supraclavicular or axillary adenopathy.     Lower Body: No right inguinal adenopathy. No left inguinal adenopathy.  Skin:    General: Skin is warm and dry.     Findings: No rash.  Neurological:     Mental Status: She is alert and oriented to person, place, and time.      Assessment and Plan:  Tara Chavez is a 39 y.o. female presenting to the [facility] Department for STI screening   1. Screening examination for venereal disease -Screenings today as below. Treat wet prep per standing order. -Patient does not meet criteria for HepB, HepC Screening.  -Counseled on warning s/sx and when to seek care. Recommended condom use with all sex and discussed importance of condom use for STI prevention. - WET PREP FOR  TRICH, YEAST, CLUE - HIV [place] [facility] - Syphilis Serology, [place] [facility] - Chlamydia/Gonorrhea [place] [facility]  2. Yeast infection -D/t itch and discharge will treat for yeast infection as below. - clotrimazole (CLOTRIMAZOLE-7) 1 % vaginal cream; Place 1 Applicatorful vaginally at bedtime.  Dispense: 45 g; Refill: 0   Return for screening as needed.  No future appointments.  Ann Held, PA-C

## 2019-03-04 NOTE — Progress Notes (Signed)
In for STD screening due to vaginal itching & irritation; desires HIV/RPR testing Sharlette Dense, RN +Yeast treated per J. Merwyn Katos, RN

## 2019-03-05 NOTE — Progress Notes (Signed)
This encounter was created in error - please disregard.

## 2019-04-04 ENCOUNTER — Encounter: Payer: Self-pay | Admitting: Family Medicine

## 2019-04-04 ENCOUNTER — Ambulatory Visit: Payer: Self-pay | Admitting: Family Medicine

## 2019-04-04 ENCOUNTER — Other Ambulatory Visit: Payer: Self-pay

## 2019-04-04 DIAGNOSIS — N898 Other specified noninflammatory disorders of vagina: Secondary | ICD-10-CM

## 2019-04-04 DIAGNOSIS — Z113 Encounter for screening for infections with a predominantly sexual mode of transmission: Secondary | ICD-10-CM

## 2019-04-04 LAB — WET PREP FOR TRICH, YEAST, CLUE
Trichomonas Exam: NEGATIVE
Yeast Exam: NEGATIVE

## 2019-04-04 MED ORDER — THERA VITAL M PO TABS: 1.0000 | ORAL_TABLET | Freq: Every day | ORAL | 0 refills | Status: AC

## 2019-04-04 NOTE — Progress Notes (Signed)
[facility] Department STI clinic/screening visit  Subjective:  Tara Chavez is a 39 y.o. female being seen today for an STI screening visit. The patient reports they do have symptoms.  Patient reports that they do desire a pregnancy in the next year.   They reported they are not interested in discussing contraception today.  Patient's last menstrual period was 04/03/2019 (exact date).   Patient has the following medical conditions:  There are no problems to display for this patient.   Chief Complaint  Patient presents with  . SEXUALLY TRANSMITTED DISEASE    HPI  Patient reports Vaginal itching and burning for 3 days, partner with sx as well. Reports burning with urination as well. Reports partner was tested in [place] and was negative.   See flowsheet for further details and programmatic requirements.    The following portions of the patient's history were reviewed and updated as appropriate: allergies, current medications, past medical history, past social history, past surgical history and problem list.  Objective:  There were no vitals filed for this visit.  Physical Exam Vitals and nursing note reviewed.  Constitutional:      Appearance: Normal appearance.  HENT:     Head: Normocephalic and atraumatic.     Mouth/Throat:     Mouth: Mucous membranes are moist.     Pharynx: Oropharynx is clear. No oropharyngeal exudate or posterior oropharyngeal erythema.  Pulmonary:     Effort: Pulmonary effort is normal.  Abdominal:     General: Abdomen is flat.     Palpations: There is no mass.     Tenderness: There is no abdominal tenderness. There is no rebound.  Genitourinary:    General: Normal vulva.     Exam position: Lithotomy position.     Pubic Area: No rash or pubic lice.      Labia:        Right: No rash or lesion.        Left: No rash or lesion.      Vagina: Normal. No vaginal discharge, erythema, bleeding or lesions.     Cervix: No  cervical motion tenderness, discharge (ph>4.5, blood in vault, no discharge), friability, lesion or erythema.     Uterus: Normal.      Adnexa: Right adnexa normal and left adnexa normal.     Rectum: Normal.  Lymphadenopathy:     Head:     Right side of head: No preauricular or posterior auricular adenopathy.     Left side of head: No preauricular or posterior auricular adenopathy.     Cervical: No cervical adenopathy.     Upper Body:     Right upper body: No supraclavicular or axillary adenopathy.     Left upper body: No supraclavicular or axillary adenopathy.     Lower Body: No right inguinal adenopathy. No left inguinal adenopathy.  Skin:    General: Skin is warm and dry.     Findings: No rash.  Neurological:     Mental Status: She is alert and oriented to person, place, and time.      Assessment and Plan:  Tara Chavez is a 39 y.o. female presenting to the [facility] Department for STI screening  1. Screening examination for venereal disease Patient accepted all screenings including oral, vaginal CT/GC and bloodwork for HIV/RPR.  Patient meets criteria for HepB screening? No. Ordered? No - not indicated Patient meets criteria for HepC screening? No. Ordered? No - Not indicated  Treat wet prep  per standing order Discussed time line for [facility] results and that patient will be called with positive results and encouraged patient to call if she had not heard in 2 weeks.  Counseled to return or seek care for continued or worsening symptoms Recommended condom use with all sex  Patient is currently using OCP  to prevent pregnancy.   - HIV [place] [facility] - Syphilis Serology, [place] [facility] - Chlamydia/Gonorrhea [place] [facility] - WET PREP FOR TRICH, YEAST, CLUE  2. Vaginal discharge Treat wet mount per standing.  Recommended OTC monostat is sx dont resolve and testing is negative Recommend urine cx if sx progress with PCP office Please give PCP list to  patient     No follow-ups on file.  No future appointments.  Federico Flake, MD

## 2019-04-04 NOTE — Progress Notes (Signed)
In for screening due to vaginal itching/irritation x 3 days; also c/o urinary frequency, dysuria; reports husband had s/s and was checked and no problem found; desires HIV/RPR testing Sharlette Dense, RN Reviewed wet prep-no Tx indicated; PCP list given Sharlette Dense, RN

## 2020-03-03 ENCOUNTER — Ambulatory Visit (LOCAL_COMMUNITY_HEALTH_CENTER): Payer: Self-pay

## 2020-03-03 ENCOUNTER — Other Ambulatory Visit: Payer: Self-pay

## 2020-03-03 VITALS — BP 101/64 | Ht 59.0 in | Wt 157.0 lb

## 2020-03-03 DIAGNOSIS — Z3201 Encounter for pregnancy test, result positive: Secondary | ICD-10-CM

## 2020-03-03 MED ORDER — PRENATAL 27-0.8 MG PO TABS: 1.0000 | ORAL_TABLET | Freq: Every day | ORAL | 0 refills | Status: AC

## 2020-03-03 NOTE — Progress Notes (Signed)
UPT positive. Plans prenatal care at [facility]. Lang. Line and V. Olmedo, interpreters today. To clerk for preadmit. Jerel Shepherd, RN

## 2020-03-04 LAB — PREGNANCY, URINE: Preg Test, Ur: POSITIVE — AB

## 2020-04-08 ENCOUNTER — Ambulatory Visit: Payer: Medicaid Other | Admitting: Advanced Practice Midwife

## 2020-04-08 ENCOUNTER — Other Ambulatory Visit: Payer: Self-pay

## 2020-04-08 ENCOUNTER — Encounter: Payer: Self-pay | Admitting: Advanced Practice Midwife

## 2020-04-08 DIAGNOSIS — Z8279 Family history of other congenital malformations, deformations and chromosomal abnormalities: Secondary | ICD-10-CM | POA: Insufficient documentation

## 2020-04-08 DIAGNOSIS — O09529 Supervision of elderly multigravida, unspecified trimester: Secondary | ICD-10-CM | POA: Insufficient documentation

## 2020-04-08 DIAGNOSIS — O0990 Supervision of high risk pregnancy, unspecified, unspecified trimester: Secondary | ICD-10-CM | POA: Insufficient documentation

## 2020-04-08 DIAGNOSIS — Z98891 History of uterine scar from previous surgery: Secondary | ICD-10-CM | POA: Insufficient documentation

## 2020-04-08 DIAGNOSIS — R6252 Short stature (child): Secondary | ICD-10-CM | POA: Insufficient documentation

## 2020-04-08 DIAGNOSIS — Z8632 Personal history of gestational diabetes: Secondary | ICD-10-CM

## 2020-04-08 DIAGNOSIS — O99212 Obesity complicating pregnancy, second trimester: Secondary | ICD-10-CM

## 2020-04-08 DIAGNOSIS — Z789 Other specified health status: Secondary | ICD-10-CM | POA: Diagnosis not present

## 2020-04-08 DIAGNOSIS — O0992 Supervision of high risk pregnancy, unspecified, second trimester: Secondary | ICD-10-CM | POA: Diagnosis not present

## 2020-04-08 DIAGNOSIS — O99210 Obesity complicating pregnancy, unspecified trimester: Secondary | ICD-10-CM

## 2020-04-08 DIAGNOSIS — O09522 Supervision of elderly multigravida, second trimester: Secondary | ICD-10-CM

## 2020-04-08 DIAGNOSIS — O0991 Supervision of high risk pregnancy, unspecified, first trimester: Secondary | ICD-10-CM

## 2020-04-08 LAB — WET PREP FOR TRICH, YEAST, CLUE
Trichomonas Exam: NEGATIVE
Yeast Exam: NEGATIVE

## 2020-04-08 LAB — URINALYSIS
Bilirubin, UA: NEGATIVE
Glucose, UA: NEGATIVE
Ketones, UA: NEGATIVE
Leukocytes,UA: NEGATIVE
Nitrite, UA: NEGATIVE
Protein,UA: NEGATIVE
RBC, UA: NEGATIVE
Specific Gravity, UA: 1.02 (ref 1.005–1.030)
Urobilinogen, Ur: 0.2 mg/dL (ref 0.2–1.0)
pH, UA: 7 (ref 5.0–7.5)

## 2020-04-08 LAB — HEMOGLOBIN, FINGERSTICK: Hemoglobin: 12.1 g/dL (ref 11.1–15.9)

## 2020-04-08 NOTE — Progress Notes (Addendum)
[facility] HEALTH DEPT [facility] [address] [place] [address] [phone]  INITIAL PRENATAL VISIT NOTE  Subjective:  Tara Chavez is a 40 y.o. MHF exsmoker [id number] (7,6) feels "happy" about surprise pregnancy with no birth control.  96-47 yo (pt unsure husband's age) employed husband of 4-5 years (pt unsure how long they have been married) feels "content" about pregnancy and he is the father of all her children at [redacted]w[redacted]d being seen today to start prenatal care at the [facility]. She has been in the [place] since 2005. She denies vaping, cigars, MJ; states last cig 2011 and last ETOH 10 years ago. Gestational diabetes with last pregnancy in 2015.  BMI at PT apt on 03/03/20=31.6; prepregnant weight unknown. LMP 01/05/20. LNMP 12/06/19. She is unemployed and living with her husband and 2 children.  She is currently monitored for the following issues for this high-risk pregnancy and has Supervision of high risk pregnancy in first trimester; Previous cesarean section x2: 05/02/12 LTCS & 2016; History of gestational diabetes 2015 with transfer of care 13 wks; Advanced maternal age in multigravida 40 yo; Obesity complicating pregnancy BMI=32.7; and Poor historian on their problem list.  Patient reports no complaints.  Contractions: Not present. Vag. Bleeding: None.  Movement: Absent. Denies leaking of fluid.   Indications for ASA therapy (per uptodate) One of the following: Previous pregnancy with preeclampsia, especially early onset and with an adverse outcome No Multifetal gestation No Chronic hypertension No Type 1 or 2 diabetes mellitus Yes Chronic kidney disease No Autoimmune disease (antiphospholipid syndrome, systemic lupus erythematosus) No  Two or more of the following: Nulliparity No Obesity (body mass index >30 kg/m2) Yes Family history of preeclampsia in mother or sister No Age ?35 years  Yes Sociodemographic characteristics (African American race, low socioeconomic level) Yes Personal risk factors (eg, previous pregnancy with low birth weight or small for gestational age infant, previous adverse pregnancy outcome [eg, stillbirth], interval >10 years between pregnancies) No   The following portions of the patient's history were reviewed and updated as appropriate: allergies, current medications, past family history, past medical history, past social history, past surgical history and problem list. Problem list updated.  Objective:   Vitals:   04/08/20 0906  BP: 94/60  Pulse: 73  Temp: (!) 97.1 F (36.2 C)  Weight: 162 lb (73.5 kg)    Fetal Status:   Fundal Height: 18 cm Movement: Absent  Presentation: Undeterminable FHR=160   Physical Exam Vitals and nursing note reviewed.  Constitutional:      General: She is not in acute distress.    Appearance: Normal appearance. She is well-developed. She is obese.  HENT:     Head: Normocephalic and atraumatic.     Right Ear: External ear normal.     Left Ear: External ear normal.     Nose: Nose normal. No congestion or rhinorrhea.     Mouth/Throat:     Lips: Pink.     Mouth: Mucous membranes are moist.     Dentition: Normal dentition. No dental caries.     Pharynx: Oropharynx is clear. Uvula midline.     Comments: Dentition: last dental exam 3-5 years ago--encouraged dental exam asap No visible caries seen Eyes:     General: No scleral icterus.    Conjunctiva/sclera: Conjunctivae normal.  Neck:     Thyroid: No thyroid mass or thyromegaly.     Comments: Thyroid without masses or enlargement Cardiovascular:  Rate and Rhythm: Normal rate.     Pulses: Normal pulses.     Comments: Extremities are warm and well perfused Pulmonary:     Effort: Pulmonary effort is normal.     Breath sounds: Normal breath sounds.  Chest:     Chest wall: No mass.  Breasts:     Tanner Score is 5. Breasts are symmetrical.     Right:  Normal. No mass, nipple discharge, skin change or axillary adenopathy.     Left: Normal. No mass, nipple discharge, skin change or axillary adenopathy.    Abdominal:     Palpations: Abdomen is soft.     Tenderness: There is no abdominal tenderness.     Comments: Gravid soft without tenderness, fundus 2FBbU, FHR=160  Genitourinary:    General: Normal vulva.     Exam position: Lithotomy position.     Pubic Area: No rash.      Labia:        Right: No rash.        Left: No rash.      Vagina: Normal. No vaginal discharge (white creamy leukorrhea, ph<4.5).     Cervix: No cervical motion tenderness or friability.     Uterus: Normal. Enlarged (Gravid 18 wks size). Not tender.      Adnexa: Right adnexa normal and left adnexa normal.     Rectum: Normal. No external hemorrhoid.  Musculoskeletal:     Right lower leg: No edema.     Left lower leg: No edema.  Lymphadenopathy:     Cervical: No cervical adenopathy.     Upper Body:     Right upper body: No axillary adenopathy.     Left upper body: No axillary adenopathy.  Skin:    General: Skin is warm.     Capillary Refill: Capillary refill takes less than 2 seconds.  Neurological:     Mental Status: She is alert.     Assessment and Plan:  Pregnancy: G3P2002 at [redacted]w[redacted]d  1. Supervision of high risk pregnancy in first trimester After counseling, pt declines all genetic testing Dating u/s ordered MFM at [facility] (s>d) along with genetic counseling for age and nephew with cardiac anomaly BMI at PT apt on 03/03/20=31.6 and prepregnant weight unknown. To do obesity labs at next appt (TSH, spot protein/creat ratio, CMP)  - Chlamydia/GC NAA, Confirmation - HIV-1/HIV-2 Qualitative RNA - Prenatal profile without Varicella or Rubella - HCV Ab w Reflex to Quant PCR - Glucose tolerance, 1 hour - Urine Culture - Lead, blood (adult age 42 yrs or greater) - [id number] Drug Screen - [place] MFM OB COMP + 14 WK; Future - WET PREP FOR TRICH, YEAST, CLUE -  Hemoglobin, venipuncture - Urinalysis (Urine Dip) - IGP, Aptima HPV - QUAD Screen [facility] Only - [facility]B MFM GENETICS REFERRAL -HgbA1C  2. Previous cesarean section X2; needs repeat c/s and delivery plans appt with [facility] at 32 wks  3. History of gestational diabetes 2015 with transfer of care 13 wks 1 hour glucola done along with HgbA1C - Hgb A1c w/o eAG  4. Antepartum multigravida of advanced maternal age   70. Obesity affecting pregnancy, antepartum 22 lb (9.979 kg) Pt agrees to take ASA 81 mg daily beginning today  6. Poor historian     Discussed overview of care and coordination with inpatient delivery practices including [facility], [facility], [facility].   Reviewed Centering pregnancy .    Preterm labor symptoms and general obstetric precautions including but not limited  to vaginal bleeding, contractions, leaking of fluid and fetal movement were reviewed in detail with the patient.  Please refer to After Visit Summary for other counseling recommendations.   No follow-ups on file.  Future Appointments  Date Time Provider Department Center  05/01/2020  8:20 AM [facility] PROVIDER [facility] None    Tara Chavez, CNMPatient here for new OB at 21 3/7, but states LMP on 01/05/20 was lighter than usual, her last normal period was in November. Patient had negative PPD in 2014, no travel since, per patient. Patient needs 1 hour gtt. Patient had GDM in previous pregnancy. Patient lives with husband and children. Burt Knack, RN

## 2020-04-08 NOTE — Progress Notes (Addendum)
Wet mount, urine dip, Hgb reviewed, no treatment indicated. Quad screen done today after provider exam and adjusting LMP (normal LMP) date.Patient counseled to go to [facility] and states she will go to [facility] the next time she is here. Patient scheduled to come back in 3 1/2 weeks, due to no appointments the first full week in April 2022. [facility] U/S referral faxed with confirmation .Burt Knack, RN

## 2020-04-09 ENCOUNTER — Telehealth: Payer: Self-pay

## 2020-04-09 DIAGNOSIS — O99810 Abnormal glucose complicating pregnancy: Secondary | ICD-10-CM | POA: Insufficient documentation

## 2020-04-09 LAB — CBC/D/PLT+RPR+RH+ABO+AB SCR
Antibody Screen: NEGATIVE
Basophils Absolute: 0 10*3/uL (ref 0.0–0.2)
Basos: 0 %
EOS (ABSOLUTE): 0.1 10*3/uL (ref 0.0–0.4)
Eos: 1 %
Hematocrit: 35.2 % (ref 34.0–46.6)
Hemoglobin: 12 g/dL (ref 11.1–15.9)
Hepatitis B Surface Ag: NEGATIVE
Immature Grans (Abs): 0 10*3/uL (ref 0.0–0.1)
Immature Granulocytes: 0 %
Lymphocytes Absolute: 1.9 10*3/uL (ref 0.7–3.1)
Lymphs: 26 %
MCH: 31.6 pg (ref 26.6–33.0)
MCHC: 34.1 g/dL (ref 31.5–35.7)
MCV: 93 fL (ref 79–97)
Monocytes Absolute: 0.3 10*3/uL (ref 0.1–0.9)
Monocytes: 4 %
Neutrophils Absolute: 4.9 10*3/uL (ref 1.4–7.0)
Neutrophils: 69 %
Platelets: 312 10*3/uL (ref 150–450)
RBC: 3.8 x10E6/uL (ref 3.77–5.28)
RDW: 12.6 % (ref 11.7–15.4)
RPR Ser Ql: NONREACTIVE
Rh Factor: POSITIVE
WBC: 7.1 10*3/uL (ref 3.4–10.8)

## 2020-04-09 LAB — LEAD, BLOOD (ADULT >= 16 YRS): Lead-Whole Blood: <1 ug/dL (ref 0–4)

## 2020-04-09 LAB — 789231 7+OXYCODONE-BUND
Amphetamines, Urine: NEGATIVE ng/mL
BENZODIAZ UR QL: NEGATIVE ng/mL
Barbiturate screen, urine: NEGATIVE ng/mL
Cannabinoid Quant, Ur: NEGATIVE ng/mL
Cocaine (Metab.): NEGATIVE ng/mL
OPIATE SCREEN URINE: NEGATIVE ng/mL
Oxycodone/Oxymorphone, Urine: NEGATIVE ng/mL
PCP Quant, Ur: NEGATIVE ng/mL

## 2020-04-09 LAB — HCV AB W REFLEX TO QUANT PCR: HCV Ab: <0.1 s/co ratio (ref 0.0–0.9)

## 2020-04-09 LAB — HCV INTERPRETATION

## 2020-04-09 LAB — HGB A1C W/O EAG: Hgb A1c MFr Bld: 5.3 % (ref 4.8–5.6)

## 2020-04-09 LAB — HIV-1/HIV-2 QUALITATIVE RNA
HIV-1 RNA, Qualitative: NONREACTIVE
HIV-2 RNA, Qualitative: NONREACTIVE

## 2020-04-09 LAB — GLUCOSE, 1 HOUR GESTATIONAL: Gestational Diabetes Screen: 162 mg/dL — ABNORMAL HIGH (ref 65–139)

## 2020-04-09 NOTE — Telephone Encounter (Signed)
Call to client with [facility] Interpreters ID [id number] as client needs 3 hour GTT (1 hr = 162) and needs notification of 05/12/20 1000 genetic counseling appt with 1100 [place]. Left message to call with number to call provided. Jossie Ng, RN

## 2020-04-09 NOTE — Telephone Encounter (Signed)
Call to [facility] [place] scheduler, to obtain appt for anatomy US and genetic counseling. Left message to call with number to call provided. Jossie Ng, RN

## 2020-04-09 NOTE — Telephone Encounter (Signed)
Call to client with [facility] appts: 05/12/20 1000 (genetic counseling) and 1100 [place]. Left message to call requesting callback for above information. Number to call provided. Jossie Ng, RN

## 2020-04-10 LAB — CHLAMYDIA/GC NAA, CONFIRMATION
Chlamydia trachomatis, NAA: NEGATIVE
Neisseria gonorrhoeae, NAA: NEGATIVE

## 2020-04-10 LAB — URINE CULTURE

## 2020-04-10 NOTE — Telephone Encounter (Addendum)
Phone call to pt with interpreter Salli Real. Pt scheduled for 3 hr GTT on 04/14/20. Pt to arrive at 8:00 to get process started. Pt given detailed instructions and counseled about procedure for 3 hr.  Pt given information about Genetic Counseling and U/S appt on 05/12/20 at [facility].

## 2020-04-11 LAB — IGP, APTIMA HPV
HPV Aptima: NEGATIVE
PAP Smear Comment: 0

## 2020-04-14 ENCOUNTER — Other Ambulatory Visit: Payer: Medicaid Other

## 2020-04-14 ENCOUNTER — Other Ambulatory Visit: Payer: Self-pay

## 2020-04-14 DIAGNOSIS — O99810 Abnormal glucose complicating pregnancy: Secondary | ICD-10-CM

## 2020-04-14 NOTE — Progress Notes (Signed)
In Nurse Clinic for 3 hr GTT. Npo since 8 pm last night. Instructions given for 3 hr GTT( remain npo until after last lab draw, to lab at designated times, stay on first floor, notify RN if N/V, may leave after last lab draw). Verbalizes understanding. M. [place], interpreter. Jerel Shepherd, RN

## 2020-04-15 LAB — GLUCOSE TOLERANCE TEST, 6 HOUR
Glucose, 1 Hour GTT: 169 mg/dL (ref 65–199)
Glucose, 2 hour: 127 mg/dL (ref 65–139)
Glucose, 3 hour: 94 mg/dL (ref 65–109)
Glucose, GTT - Fasting: 82 mg/dL (ref 65–99)

## 2020-04-16 ENCOUNTER — Telehealth: Payer: Self-pay | Admitting: Obstetrics and Gynecology

## 2020-04-16 NOTE — Telephone Encounter (Signed)
I spoke with Ms. Geni Bers with the aid of a Spanish interpreter to offer an earlier visit for ultrasound and genetic counseling in this pregnancy.  She is current scheduled for 4/12 in [place] (our next opening), but we have availability on 4/5 in our [place] office if the patient would prefer.  When I inquired about if she would use any testing information to make choices about the outcome of the pregnancy, she stated that she would just want the information for planning purposes.  Given this information, the there is not a time constraint to ensure availability of options for this pregnancy.  She would prefer to keep the visit in our [place] office on 4/12.  We can be reaching at [phone] with any questions or concerns.  Cherly Anderson, MS, CGC

## 2020-04-24 ENCOUNTER — Encounter: Payer: Self-pay | Admitting: Advanced Practice Midwife

## 2020-04-24 ENCOUNTER — Telehealth: Payer: Self-pay | Admitting: Student

## 2020-04-24 DIAGNOSIS — O280 Abnormal hematological finding on antenatal screening of mother: Secondary | ICD-10-CM | POA: Insufficient documentation

## 2020-04-24 NOTE — Telephone Encounter (Signed)
LM with [facility]- Per Hazle Coca, CNM request, patient needs and earlier genetic consult and ultrasound scheduled with [facility]. Currently scheduled for 05/12/20. Provider requested patient reschedule to NEXT earliest appointment available due to ABNORMAL QUAD. Per clinic voicemail, clinic is only open Tues & Thurs. This RN LM today, but if no RC on Tues, will need to F/U with [facility]. Sharlyne Pacas, RN

## 2020-04-28 NOTE — Telephone Encounter (Signed)
Phone call to [facility]. Spoke with Toni Amend (fax # [phone]). Discussed concerns about abnormal quad and pt currently 20.4 today and Lanora Manis Sciora's request to have the already scheduled detailed U/S and consult moved up (currently 05/12/20, needs it before then). Courtney requested that referral/details be faxed directly to her at [phone] and she will try to get the pt worked in before 05/12/20. Toni Amend did emphasize the faxed referrals should not come to her in the future; special circumstance for this situation.)   Rubin Payor, RN faxing the referral/details to [place] right now.

## 2020-04-28 NOTE — Telephone Encounter (Signed)
Per call from Exie Parody RN at [facility], Toni Amend at [facility] is requesting paper [place] referral be refaxed directly to her so she can attempt to reschedule earlier per [facility] provider request. Requested info sent with fax confirmation received. Jossie Ng, RN

## 2020-05-01 ENCOUNTER — Ambulatory Visit: Payer: Medicaid Other | Admitting: Advanced Practice Midwife

## 2020-05-01 ENCOUNTER — Other Ambulatory Visit: Payer: Self-pay

## 2020-05-01 VITALS — BP 89/54 | HR 71 | Temp 97.2°F | Wt 161.6 lb

## 2020-05-01 DIAGNOSIS — O280 Abnormal hematological finding on antenatal screening of mother: Secondary | ICD-10-CM | POA: Diagnosis not present

## 2020-05-01 DIAGNOSIS — O99212 Obesity complicating pregnancy, second trimester: Secondary | ICD-10-CM

## 2020-05-01 DIAGNOSIS — O09529 Supervision of elderly multigravida, unspecified trimester: Secondary | ICD-10-CM

## 2020-05-01 DIAGNOSIS — O0992 Supervision of high risk pregnancy, unspecified, second trimester: Secondary | ICD-10-CM

## 2020-05-01 DIAGNOSIS — O09522 Supervision of elderly multigravida, second trimester: Secondary | ICD-10-CM | POA: Diagnosis not present

## 2020-05-01 DIAGNOSIS — Z98891 History of uterine scar from previous surgery: Secondary | ICD-10-CM

## 2020-05-01 DIAGNOSIS — O99810 Abnormal glucose complicating pregnancy: Secondary | ICD-10-CM

## 2020-05-01 DIAGNOSIS — O99210 Obesity complicating pregnancy, unspecified trimester: Secondary | ICD-10-CM

## 2020-05-01 NOTE — Progress Notes (Signed)
   PRENATAL VISIT NOTE  Subjective:  Tara Chavez is a 40 y.o. G3P2002 at [redacted]w[redacted]d being seen today for ongoing prenatal care.  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  Patient reports no complaints.  Contractions: Not present. Vag. Bleeding: None.  Movement: Present. Denies leaking of fluid/ROM.   The following portions of the patient's history were reviewed and updated as appropriate: allergies, current medications, past family history, past medical history, past social history, past surgical history and problem list. Problem list updated.  Objective:   Vitals:   05/01/20 0852  BP: (!) 89/54  Pulse: 71  Temp: (!) 97.2 F (36.2 C)  Weight: 161 lb 9.6 oz (73.3 kg)    Fetal Status: Fetal Heart Rate (bpm): 150 Fundal Height: 20 cm Movement: Present     General:  Alert, oriented and cooperative. Patient is in no acute distress.  Skin: Skin is warm and dry. No rash noted.   Cardiovascular: Normal heart rate noted  Respiratory: Normal respiratory effort, no problems with respiration noted  Abdomen: Soft, gravid, appropriate for gestational age.  Pain/Pressure: Absent     Pelvic: Cervical exam deferred        Extremities: Normal range of motion.  Edema: None  Mental Status: Normal mood and affect. Normal behavior. Normal judgment and thought content.   Assessment and Plan:  Pregnancy: G3P2002 at [redacted]w[redacted]d  1. Supervision of  high risk pregnancy in second trimester Obesity labs not done at initial so doing today Not working; feels well - Protein / creatinine ratio, urine  (Spot) - TSH - Comprehensive metabolic panel  2. Abnormal quad screen 04/08/20 Increased risk Downs and Trisomy 13. Has GC and first u/s apt 05/12/20; pt counseled   3. Obesity affecting pregnancy, antepartum Taking ASA 81 mg daily 21 lb 9.6 oz (9.798 kg) Walking 4x/wk x 30 min 1 lb wt loss in past 4 wks--pt states she is eating "healthy"  4. Antepartum multigravida of advanced maternal age 36 yo.  GC appt 05/12/20 for abnormal Quad screen 5. Previous cesarean section x2: 05/02/12 LTCS & 2016 Needs delivery plans apt 32 wks  6. Abnormal glucose affecting pregnancy 04/08/20 1 hour=162 3 hour GTT =wnl on 04/14/20 To do 3 hr GTT at 28 wks  Preterm labor symptoms and general obstetric precautions including but not limited to vaginal bleeding, contractions, leaking of fluid and fetal movement were reviewed in detail with the patient. Please refer to After Visit Summary for other counseling recommendations.  Return in about 4 weeks (around 05/29/2020) for routine PNC.  Future Appointments  Date Time Provider Department Center  05/12/2020 10:00 AM [facility] GENETIC RM [facility] None  05/12/2020 11:00 AM [facility] US1 [facility]    Alberteen Spindle, CNM

## 2020-05-01 NOTE — Progress Notes (Signed)
Aware of 05/12/2020 [facility] appt. Jossie Ng, RN

## 2020-05-05 NOTE — Telephone Encounter (Signed)
As of today, [facility] is unable to move up 05/12/2020 appt to an earlier day. Jossie Ng, RN

## 2020-05-06 LAB — PROTEIN / CREATININE RATIO, URINE
Creatinine, Urine: 169.5 mg/dL
Protein, Ur: 33.9 mg/dL
Protein/Creat Ratio: 200 mg/g creat (ref 0–200)

## 2020-05-06 LAB — COMPREHENSIVE METABOLIC PANEL
ALT: 14 IU/L (ref 0–32)
AST: 17 IU/L (ref 0–40)
Albumin/Globulin Ratio: 1.4 (ref 1.2–2.2)
Albumin: 3.9 g/dL (ref 3.8–4.8)
Alkaline Phosphatase: 62 IU/L (ref 44–121)
BUN/Creatinine Ratio: 12 (ref 9–23)
BUN: 6 mg/dL (ref 6–24)
Bilirubin Total: <0.2 mg/dL (ref 0.0–1.2)
CO2: 18 mmol/L — ABNORMAL LOW (ref 20–29)
Calcium: 8.9 mg/dL (ref 8.7–10.2)
Chloride: 102 mmol/L (ref 96–106)
Creatinine, Ser: 0.51 mg/dL — ABNORMAL LOW (ref 0.57–1.00)
Globulin, Total: 2.8 g/dL (ref 1.5–4.5)
Glucose: 93 mg/dL (ref 65–99)
Potassium: 3.9 mmol/L (ref 3.5–5.2)
Sodium: 137 mmol/L (ref 134–144)
Total Protein: 6.7 g/dL (ref 6.0–8.5)
eGFR: 119 mL/min/{1.73_m2} (ref >59)

## 2020-05-06 LAB — TSH: TSH: 1.99 u[IU]/mL (ref 0.450–4.500)

## 2020-05-12 ENCOUNTER — Ambulatory Visit: Payer: Self-pay | Attending: Obstetrics

## 2020-05-12 ENCOUNTER — Other Ambulatory Visit: Payer: Self-pay

## 2020-05-12 ENCOUNTER — Ambulatory Visit (HOSPITAL_BASED_OUTPATIENT_CLINIC_OR_DEPARTMENT_OTHER): Payer: Self-pay

## 2020-05-12 DIAGNOSIS — O321XX0 Maternal care for breech presentation, not applicable or unspecified: Secondary | ICD-10-CM

## 2020-05-12 DIAGNOSIS — O0991 Supervision of high risk pregnancy, unspecified, first trimester: Secondary | ICD-10-CM

## 2020-05-12 DIAGNOSIS — Z3A19 19 weeks gestation of pregnancy: Secondary | ICD-10-CM | POA: Insufficient documentation

## 2020-05-12 DIAGNOSIS — O99810 Abnormal glucose complicating pregnancy: Secondary | ICD-10-CM

## 2020-05-12 DIAGNOSIS — O285 Abnormal chromosomal and genetic finding on antenatal screening of mother: Secondary | ICD-10-CM

## 2020-05-12 DIAGNOSIS — Z8279 Family history of other congenital malformations, deformations and chromosomal abnormalities: Secondary | ICD-10-CM

## 2020-05-12 DIAGNOSIS — O99212 Obesity complicating pregnancy, second trimester: Secondary | ICD-10-CM | POA: Insufficient documentation

## 2020-05-12 DIAGNOSIS — O09522 Supervision of elderly multigravida, second trimester: Secondary | ICD-10-CM | POA: Insufficient documentation

## 2020-05-12 DIAGNOSIS — O280 Abnormal hematological finding on antenatal screening of mother: Secondary | ICD-10-CM

## 2020-05-12 NOTE — Progress Notes (Signed)
Referring Provider:   AC[facility]  Length of Consultation:45 minutes  [name] was referred to [facility] for genetic counseling because of maternal age and an abnormal maternal serum screen.  The patient will be 40 years old at the time of delivery.  This note summarizes the information we discussed with the aid of a Spanish interpreter.  The patient was counseled by Pricilla Handler, genetic counseling intern supervised by Katrina Stack, MS, CGC.    We explained that the chance of a chromosome abnormality increases with maternal age.  Chromosomes and examples of chromosome problems were reviewed.  Humans typically have 46 chromosomes in each cell, with half passed through each sperm and egg.  Any change in the number or structure of chromosomes can increase the risk of problems in the physical and mental development of a pregnancy.   Based upon age of the patient and the current gestational age, the chance of any chromosome abnormality was 1 in 67.  The chance of Down syndrome, the most common chromosome problem associated with maternal age, was 1 in 43.  The risk of chromosome problems is in addition to the 3% general population risk for birth defects and mental retardation.  The greatest chance, of course, is that the baby would be born in good health.  Maternal serum marker screening, a blood test that measures pregnancy proteins, can provide risk assessments for Down syndrome, trisomy 18, and open neural tube defects (spina bifida, anencephaly). Because it does not directly examine the fetus, it cannot positively diagnose or rule out these problems.  [name] had maternal serum screening through her ob/gyn.  The maternal serum screen revealed protein levels that increased the chance of Down syndrome and Trisomy 18 in the pregnancy.  Given the maternal serum screen results, the chance for the baby to have Down Syndrome is now estimated to be 1 in 4 and the chance  for Trisomy 18 is now 1 in 59.  However, dating based upon today's ultrasound showed that the maternal serum screening was drawn at [redacted] weeks gestation, and therefore is invalid.  We discussed the following prenatal screening and testing options for this pregnancy:  Targeted ultrasound uses high frequency sound waves to create an image of the developing fetus.  An ultrasound is often recommended as a routine means of evaluating the pregnancy.  It is also used to screen for fetal anatomy problems (for example, a heart defect) that might be suggestive of a chromosomal or other abnormality.   Amniocentesis involves the removal of a small amount of amniotic fluid from the sac surrounding the fetus with the use of a thin needle inserted through the maternal abdomen and uterus.  Ultrasound guidance is used throughout the procedure.  Fetal cells from amniotic fluid are directly evaluated and > 99.5% of chromosome problems and > 98% of open neural tube defects can be detected. This procedure is generally performed after the 15th week of pregnancy.  The main risks to this procedure include complications leading to miscarriage in less than 1 in 200 cases (0.5%).  We also reviewed the availability of cell free fetal DNA testing from maternal blood to determine whether or not the baby may have either Down syndrome, trisomy 20, or trisomy 14.  This test utilizes a maternal blood sample and DNA sequencing technology to isolate circulating cell free fetal DNA from maternal plasma.  The fetal DNA can then be analyzed for DNA sequences that are derived from the three  most common chromosomes involved in aneuploidy, chromosomes 13, 18, and 21.  If the overall amount of DNA is greater than the expected level for any of these chromosomes, aneuploidy is suspected.  While we do not consider it a replacement for invasive testing and karyotype analysis, a negative result from this testing would be reassuring, though not a guarantee  of a normal chromosome complement for the baby.  An abnormal result is certainly suggestive of an abnormal chromosome complement, though we would still recommend amniocentesis to confirm any findings from this testing.  Cystic Fibrosis and Spinal Muscular Atrophy (SMA) screening were also discussed with the patient. Both conditions are recessive, which means that both parents must be carriers in order to have a child with the disease.  Cystic fibrosis (CF) is one of the most common genetic conditions in persons of Caucasian ancestry.  This condition occurs in approximately 1 in 2,500 Caucasian persons and results in thickened secretions in the lungs, digestive, and reproductive systems.  For a baby to be at risk for having CF, both of the parents must be carriers for this condition.  Approximately 1 in 40 Caucasian persons is a carrier for CF.  Current carrier testing looks for the most common mutations in the gene for CF and can detect approximately 90% of carriers in the Caucasian population.  This means that the carrier screening can greatly reduce, but cannot eliminate, the chance for an individual to have a child with CF.  If an individual is found to be a carrier for CF, then carrier testing would be available for the partner. As part of [place] 's newborn screening profile, all babies born in the state of [place] will have a two-tier screening process.  Specimens are first tested to determine the concentration of immunoreactive trypsinogen (IRT).  The top 5% of specimens with the highest IRT values then undergo DNA testing using a panel of over 40 common CF mutations. SMA is a neurodegenerative disorder that leads to atrophy of skeletal muscle and overall weakness.  This condition is also more prevalent in the Caucasian population, with 1 in 40-1 in 60 persons being a carrier and 1 in 6,000-1 in 10,000 children being affected.  There are multiple forms of the disease, with some causing death in  infancy to other forms with survival into adulthood.  The genetics of SMA is complex, but carrier screening can detect up to 95% of carriers in the Caucasian population.  Similar to CF, a negative result can greatly reduce, but cannot eliminate, the chance to have a child with SMA.  We obtained a detailed family history and pregnancy history.  The patient reported a sister who has a son with a heart murmur.  He is also said to be small in stature but with normal cognitive development.  Heart murmurs are not uncommon and can reflect various types of heart conditions which may or may not require medical intervention.  Without knowledge of the cause for the murmur it is difficult to determine the likelihood of a strong genetic component.  Generally speaking, based on the degree of relation to this pregnancy, the chance for a structural heart defect is likely not significantly greater than the general population risk.   The patient also reported a maternal first cousin who has problems with stuttering and has problems with movement in one arm and leg (both are turned in and do not move properly).  The family stated this is due to an accident at 59  months of age, as the child did not have these concerns prior to that accident.  If this is the result of trauma, we would not expect other family members to be at risk.  The father of the baby is reported to have a first cousin with cognitive delays who required full care with daily living.  She passed away as an adult. No additional details are known about her condition.  If more is learned about the specific diagnosis, we are happy to discuss this history further. The family history was reported to be unremarkable for birth defects, developmental delays, recurrent pregnancy loss or known chromosome abnormalities.   Ms.  Tara Chavez stated that this is her third pregnancy.  She reported no complications or exposures to medications, tobacco, alcohol or recreational  drugs in this pregnancy.  After consideration of the options, the patient elected to proceed with an ultrasound only and to declined other screening and testing options.  .  An ultrasound was performed at the time of the visit.  The gestational age was consistent with 19 weeks, 4 days which is three weeks earlier than expected by LMP.  Given the earlier gestational age, the patient was counseled that the maternal serum screening was drawn too early and results cannot be interpreted.  She declined repeat aneuploidy screening. The fetal anatomy was suboptimally seen due to fetal position, though no markers of aneuploidy were noted at this time.  It is important to remember that ultrasound can not diagnose chromosome abnormalities or all birth defects. She will return for a follow up ultrasound. See ultrasound report for details.  Plan of care:  Return on 06/09/20 for follow up anatomy ultrasound  Pt declined aneuploidy screening  Pt declined carrier screening for CF and SMA.  Prior hemoglobinopathy screening"negative" per Epic in 2013.   [name] was encouraged to call with questions or concerns.  We can be contacted at [phone].   Cherly Anderson, MS, CGC

## 2020-05-13 ENCOUNTER — Encounter: Payer: Self-pay | Admitting: Advanced Practice Midwife

## 2020-05-29 ENCOUNTER — Other Ambulatory Visit: Payer: Self-pay

## 2020-05-29 ENCOUNTER — Ambulatory Visit: Payer: Medicaid Other | Admitting: Advanced Practice Midwife

## 2020-05-29 VITALS — BP 102/65 | HR 76 | Temp 97.4°F | Wt 167.8 lb

## 2020-05-29 DIAGNOSIS — O99810 Abnormal glucose complicating pregnancy: Secondary | ICD-10-CM

## 2020-05-29 DIAGNOSIS — O09522 Supervision of elderly multigravida, second trimester: Secondary | ICD-10-CM

## 2020-05-29 DIAGNOSIS — O0991 Supervision of high risk pregnancy, unspecified, first trimester: Secondary | ICD-10-CM

## 2020-05-29 DIAGNOSIS — Z98891 History of uterine scar from previous surgery: Secondary | ICD-10-CM | POA: Diagnosis not present

## 2020-05-29 DIAGNOSIS — O99210 Obesity complicating pregnancy, unspecified trimester: Secondary | ICD-10-CM

## 2020-05-29 DIAGNOSIS — O99212 Obesity complicating pregnancy, second trimester: Secondary | ICD-10-CM

## 2020-05-29 NOTE — Progress Notes (Signed)
   PRENATAL VISIT NOTE  Subjective:  Tara Chavez is a 40 y.o. G3P2002 at [redacted]w[redacted]d being seen today for ongoing prenatal care.  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  Patient reports no complaints.  Contractions: Not present. Vag. Bleeding: None.  Movement: Present. Denies leaking of fluid/ROM.   The following portions of the patient's history were reviewed and updated as appropriate: allergies, current medications, past family history, past medical history, past social history, past surgical history and problem list. Problem list updated.  Objective:   Vitals:   05/29/20 0833  BP: 102/65  Pulse: 76  Temp: (!) 97.4 F (36.3 C)  Weight: 167 lb 12.8 oz (76.1 kg)    Fetal Status: Fetal Heart Rate (bpm): 150 Fundal Height: 24 cm Movement: Present     General:  Alert, oriented and cooperative. Patient is in no acute distress.  Skin: Skin is warm and dry. No rash noted.   Cardiovascular: Normal heart rate noted  Respiratory: Normal respiratory effort, no problems with respiration noted  Abdomen: Soft, gravid, appropriate for gestational age.  Pain/Pressure: Absent     Pelvic: Cervical exam deferred        Extremities: Normal range of motion.  Edema: None  Mental Status: Normal mood and affect. Normal behavior. Normal judgment and thought content.   Assessment and Plan:  Pregnancy: G3P2002 at [redacted]w[redacted]d  1. Supervision of high  risk pregnancy in first trimester Reviewed GC and u/s on 05/12/20 at 19 4/7 with change in [facility] by this u/s to 10/02/20, anterior placenta, AFI wnl, 3VC, suboptimal anatomy views so f/u growth u/s 06/09/20 GC apt for AMA as well as initial Quad screen abnormal but due to change in [facility] was invalid; pt declined NIPS or any further aneuploidy testing Working 5 hours/wk  2. Obesity affecting pregnancy, antepartum 27 lb 12.8 oz (12.6 kg) Taking ASA 81 mg daily Walking 2-3x/wk x 1 hour  3. Abnormal glucose affecting pregnancy 04/08/20 1 hour=162 3 hr GTT 04/14/20=wnl  4. Multigravida of advanced maternal age in second trimester 40 yo; kept GC apt 05/12/20 and because Quad screen was invalid due to drawn too early, pt declined NIPS or any further aneuploidy testing  5. Previous cesarean section x2: 05/02/12 LTCS & 2016 Needs delivery plans apt 32 wks for repeat c/s   Preterm labor symptoms and general obstetric precautions including but not limited to vaginal bleeding, contractions, leaking of fluid and fetal movement were reviewed in detail with the patient. Please refer to After Visit Summary for other counseling recommendations.  No follow-ups on file.  Future Appointments  Date Time Provider Department Center  06/09/2020 10:00 AM [facility]    Alberteen Spindle, CNM

## 2020-05-29 NOTE — Progress Notes (Signed)
Patient here for MH RV at 22 weeks. CMHRP and PHQ9 today. .Xzaria Teo Brewer-Jensen, RN  

## 2020-06-08 ENCOUNTER — Other Ambulatory Visit: Payer: Self-pay | Admitting: Advanced Practice Midwife

## 2020-06-08 DIAGNOSIS — O09522 Supervision of elderly multigravida, second trimester: Secondary | ICD-10-CM

## 2020-06-08 DIAGNOSIS — O99212 Obesity complicating pregnancy, second trimester: Secondary | ICD-10-CM

## 2020-06-08 DIAGNOSIS — Z98891 History of uterine scar from previous surgery: Secondary | ICD-10-CM

## 2020-06-09 ENCOUNTER — Other Ambulatory Visit: Payer: Self-pay

## 2020-06-09 ENCOUNTER — Ambulatory Visit: Payer: Self-pay | Attending: Maternal & Fetal Medicine

## 2020-06-09 DIAGNOSIS — O09522 Supervision of elderly multigravida, second trimester: Secondary | ICD-10-CM | POA: Insufficient documentation

## 2020-06-09 DIAGNOSIS — Z3A23 23 weeks gestation of pregnancy: Secondary | ICD-10-CM | POA: Insufficient documentation

## 2020-06-09 DIAGNOSIS — Z98891 History of uterine scar from previous surgery: Secondary | ICD-10-CM | POA: Insufficient documentation

## 2020-06-09 DIAGNOSIS — O99212 Obesity complicating pregnancy, second trimester: Secondary | ICD-10-CM | POA: Insufficient documentation

## 2020-06-09 IMAGING — US US MFM OB FOLLOW-UP
1 series · 14 of 28 positions shown · non-contrast
Comparison: none

[Series 1: us mfm ob follow-up · 14 of 65 slices shown]
[im 3/65]
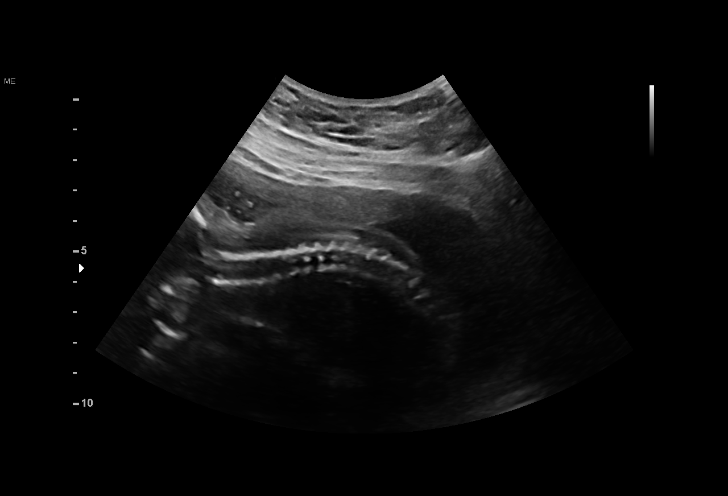
[im 8/65]
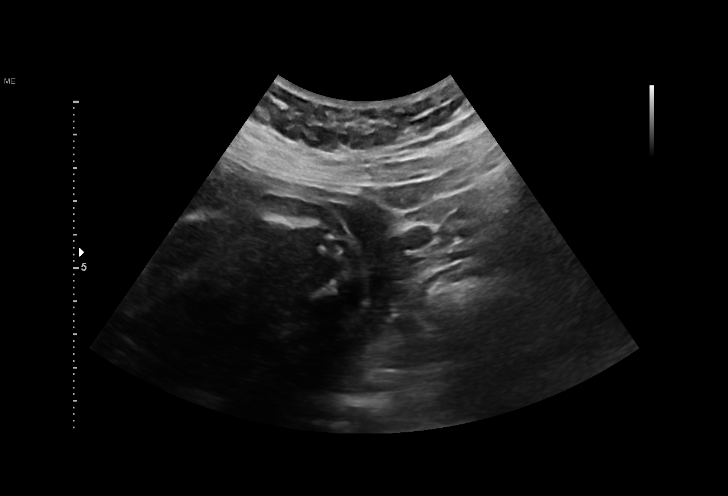
[im 12/65]
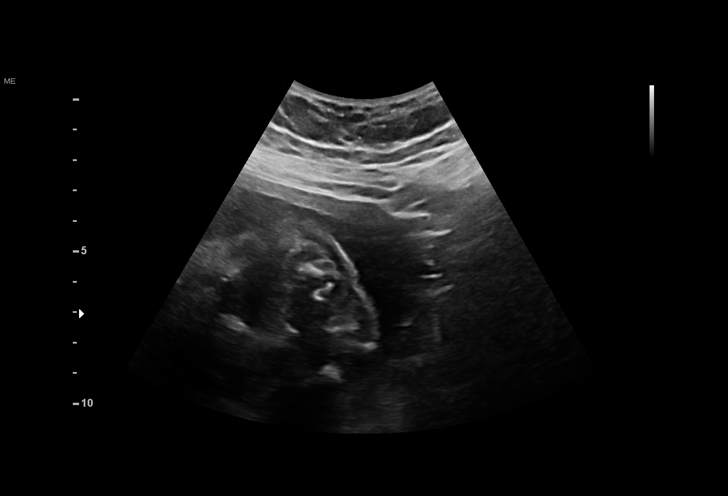
[im 17/65]
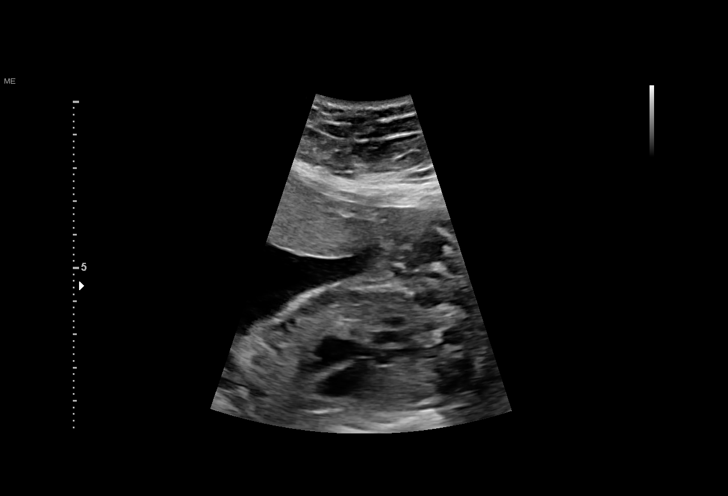
[im 22/65]
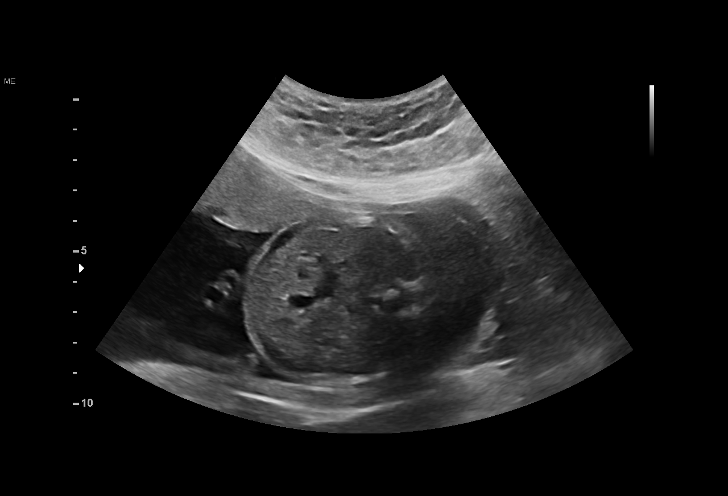
[im 27/65]
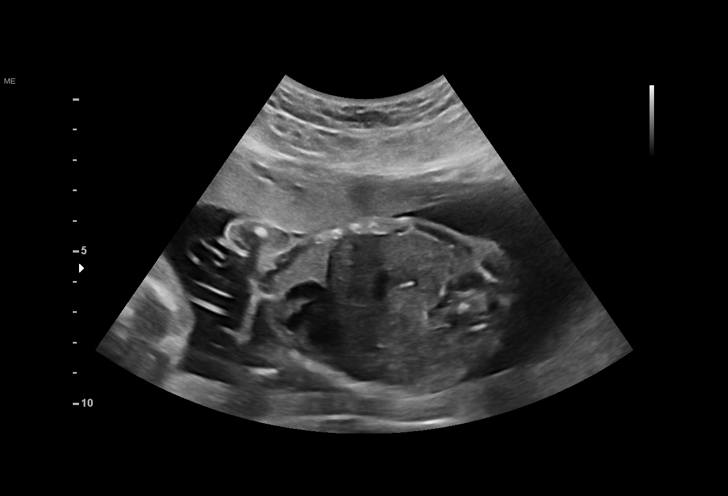
[im 31/65]
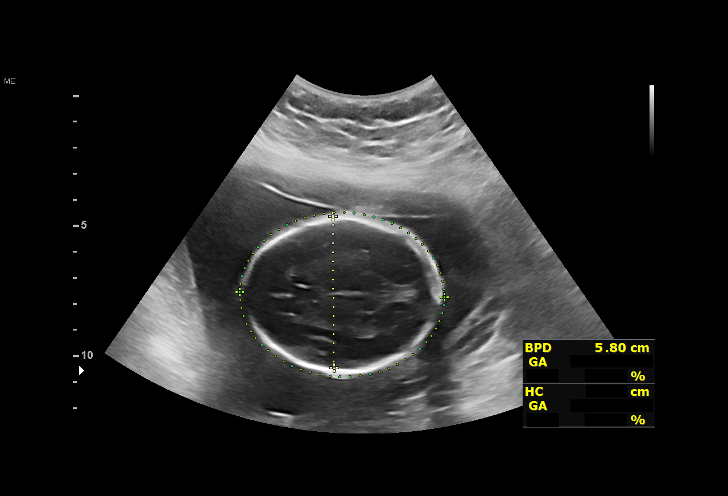
[im 36/65]
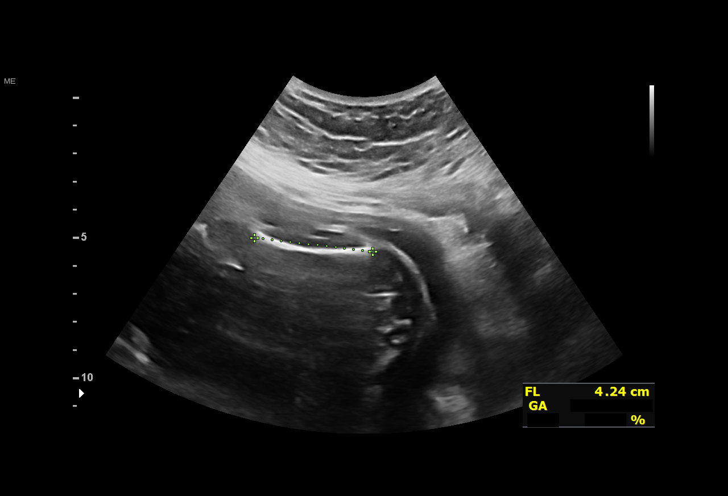
[im 41/65]
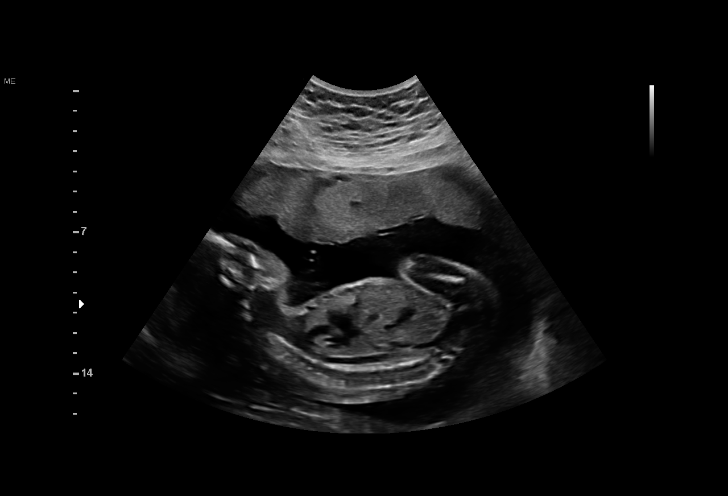
[im 46/65]
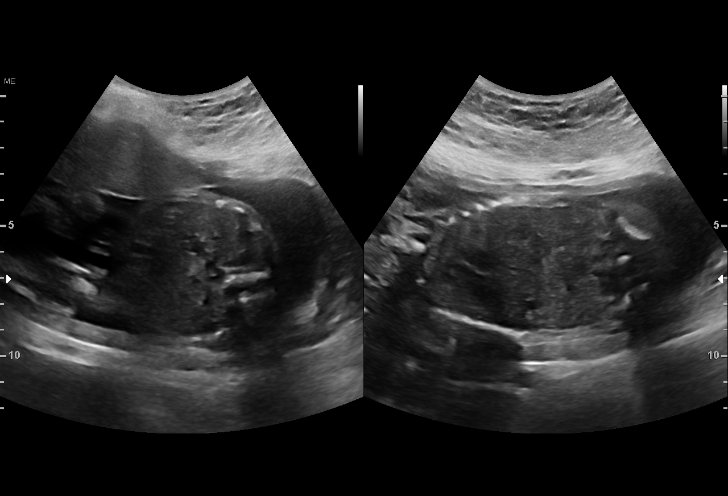
[im 50/65]
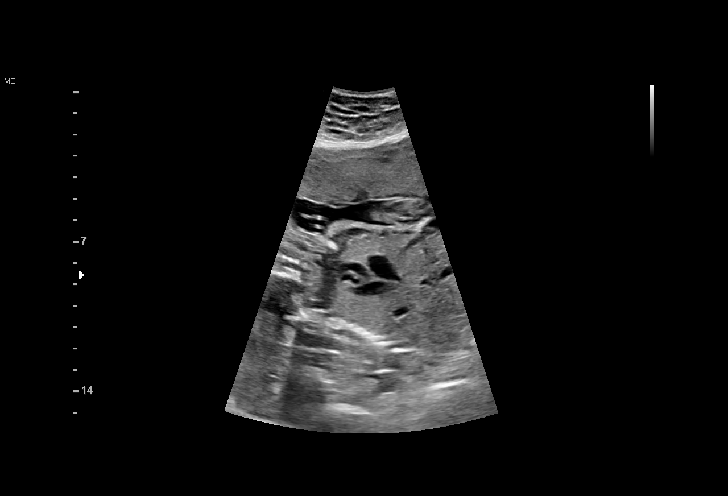
[im 55/65]
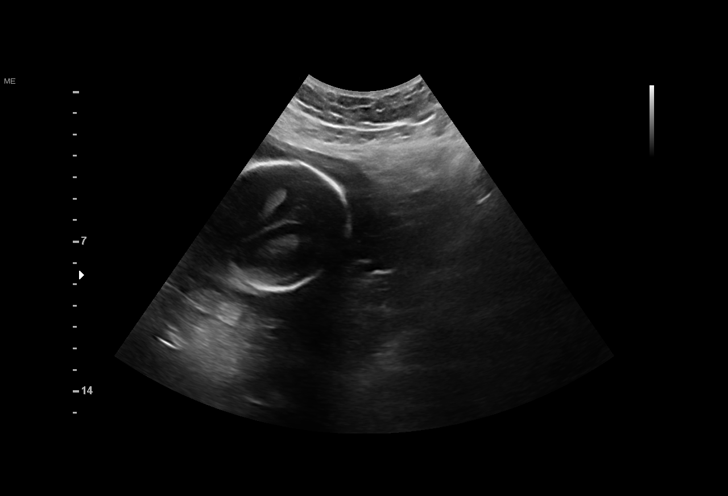
[im 60/65]
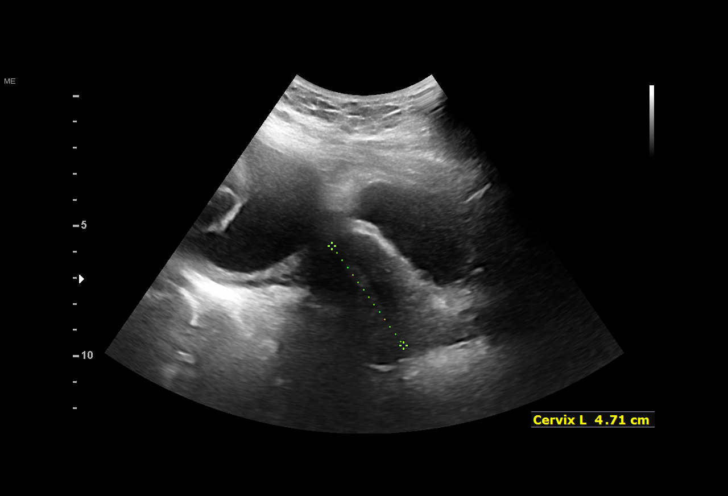
[im 65/65]
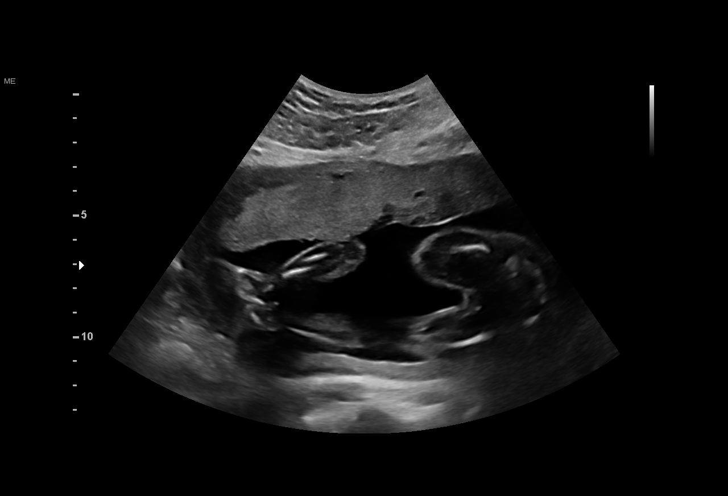

[14 of 28 positions shown; findings below may reference images not displayed]

LITIHO

                   MELIZA CNM

Indications

 23 weeks gestation of pregnancy
 Advanced maternal age multigravida 35+,
 second trimester
 History of gestational diabetes prior
 pregnancy
Fetal Evaluation

 Num Of Fetuses:         1
 Fetal Heart Rate(bpm):  148
 Cardiac Activity:       Observed
 Presentation:           Transverse, head to maternal right
 Placenta:               Anterior

 Amniotic Fluid
 AFI FV:      Within normal limits

 AFI Sum(cm)     %Tile
 21.5            93
Biometry

 BPD:        58  mm     G. Age:  23w 5d         52  %    CI:         68.6   %    70 - 86
                                                         FL/HC:      19.2   %    18.7 -
 HC:      223.8  mm     G. Age:  24w 3d         67  %    HC/AC:      1.15        1.05 -
 AC:      195.3  mm     G. Age:  24w 2d         62  %    FL/BPD:     74.1   %    71 - 87
 FL:         43  mm     G. Age:  24w 0d         54  %    FL/AC:      22.0   %    20 - 24
 HUM:      40.6  mm     G. Age:  24w 5d         66  %
 Est. FW:     667  gm      1 lb 8 oz     70  %
Gestational Age

 LMP:           26w 4d        Date:  12/06/19                 EDD:   09/11/20
 U/S Today:     24w 1d                                        EDD:   09/28/20
 Best:          23w 4d     Det. By:  U/S  (05/12/20)          EDD:   10/02/20
Anatomy

 Cranium:               Appears normal         Aortic Arch:            Appears normal
 Cavum:                 Appears normal         Ductal Arch:            Appears normal
 Ventricles:            Appears normal         Diaphragm:              Appears normal
 Choroid Plexus:        Previously seen        Stomach:                Appears normal, left
                                                                       sided
 Cerebellum:            Previously seen        Abdomen:                Appears normal
 Posterior Fossa:       Previously seen        Abdominal Wall:         Appears nml (cord
                                                                       insert, abd wall)
 Nuchal Fold:           Previously seen        Cord Vessels:           Appears normal (3
                                                                       vessel cord)
 Face:                  Orbits and profile     Kidneys:                Appear normal
                        previously seen
 Lips:                  Previously seen        Bladder:                Appears normal
 Heart:                 Appears normal         Spine:                  Appears normal
                        (4CH, axis, and
                        situs)
 RVOT:                  Previously seen        Upper Extremities:      Previously seen
 LVOT:                  Previously seen        Lower Extremities:      Previously seen
Cervix Uterus Adnexa

 Cervix
 Length:            4.7  cm.
Impression

 Follow up growth due to advanced maternal age (40 yo ) and
 to complete the fetal anatomy.
 Normal interval growth with measurements consistent with
 dates
 Good fetal movement and amniotic fluid volume

 Ms. Mikhail had an abnormal quad screen result but
 learned that it was drawn to soon. She declined additional
 testing. Please see the report by our genetic counselor
 Myriian Moniz.
Recommendations

 Follow up growth is scheduled in 6 weeks
 Continue serial growth
 Initiate weekly testing at 36 weeks.

## 2020-06-10 ENCOUNTER — Encounter: Payer: Self-pay | Admitting: Advanced Practice Midwife

## 2020-06-22 ENCOUNTER — Telehealth: Payer: Self-pay

## 2020-06-22 NOTE — Telephone Encounter (Signed)
Call to client with [facility] [employer] ID # [id number] to reschedule 06/23/2020 am appt due to provider availability. Appt reschedule for 06/30/2020 per client request with arrival time of 1000. Jossie Ng, RN

## 2020-06-23 ENCOUNTER — Ambulatory Visit: Payer: Self-pay

## 2020-06-24 NOTE — Addendum Note (Signed)
Addended by: Kenidee Cregan on: 06/24/2020 03:33 PM   Modules accepted: Orders  

## 2020-06-30 ENCOUNTER — Ambulatory Visit: Payer: Self-pay | Admitting: Family Medicine

## 2020-06-30 ENCOUNTER — Other Ambulatory Visit: Payer: Self-pay

## 2020-06-30 VITALS — BP 101/56 | HR 88 | Temp 99.2°F | Wt 171.0 lb

## 2020-06-30 DIAGNOSIS — O99212 Obesity complicating pregnancy, second trimester: Secondary | ICD-10-CM

## 2020-06-30 DIAGNOSIS — O0992 Supervision of high risk pregnancy, unspecified, second trimester: Secondary | ICD-10-CM

## 2020-06-30 DIAGNOSIS — O0990 Supervision of high risk pregnancy, unspecified, unspecified trimester: Secondary | ICD-10-CM

## 2020-06-30 DIAGNOSIS — O99210 Obesity complicating pregnancy, unspecified trimester: Secondary | ICD-10-CM

## 2020-06-30 NOTE — Progress Notes (Signed)
   PRENATAL VISIT NOTE  Subjective:  Tara Chavez is a 40 y.o. G3P2002 at [redacted]w[redacted]d being seen today for ongoing prenatal care.  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  Patient reports no complaints.  Contractions: Irritability. Vag. Bleeding: None.  Movement: Present. Denies leaking of fluid/ROM.   The following portions of the patient's history were reviewed and updated as appropriate: allergies, current medications, past family history, past medical history, past social history, past surgical history and problem list. Problem list updated.  Objective:   Vitals:   06/30/20 1016  BP: (!) 101/56  Pulse: 88  Temp: 99.2 F (37.3 C)  Weight: 171 lb (77.6 kg)    Fetal Status: Fetal Heart Rate (bpm): 137 Fundal Height: 29 cm Movement: Present     General:  Alert, oriented and cooperative. Patient is in no acute distress.  Skin: Skin is warm and dry. No rash noted.   Cardiovascular: Normal heart rate noted  Respiratory: Normal respiratory effort, no problems with respiration noted  Abdomen: Soft, gravid, appropriate for gestational age.  Pain/Pressure: Absent     Pelvic: Cervical exam deferred        Extremities: Normal range of motion.  Edema: None  Mental Status: Normal mood and affect. Normal behavior. Normal judgment and thought content.   Assessment and Plan:  Pregnancy: G3P2002 at [redacted]w[redacted]d  1. Supervision of high risk  pregnancy, antepartum -taking aspirin as directed  -pt had questions about vaginal dryness and if lubricant can be used- encouraged to use water based lubricant- gave pictures of examples of lubricant.  - BTL information given but concerned about cost - informed of funding for vasectomies, pt reports partner is not interested in a vasectomy.  -anticipated guidance- next appt. 28 week labs, 3 hr gtt and visits starting at Q 2 weeks.   - aware of appt  On 6/23.   2. Obesity affecting pregnancy, antepartum - patient is walking ~1-2 hrs daily-  - reports nocturnal leg cramps- discussed stretching before walking and after walking, changing shoes and reducing length of walking to 30 mins to 1 hrs and increase pace to increase HR.  -encouraged wear shoes that have arch support and not flat shoes like converse or crocs.   - calf stretching exercise information and demonstration given.    Preterm labor symptoms and general obstetric precautions including but not limited to vaginal bleeding, contractions, leaking of fluid and fetal movement were reviewed in detail with the patient. Please refer to After Visit Summary for other counseling recommendations.  Return in about 2 weeks (around 07/14/2020) for 28 week labs.  Future Appointments  Date Time Provider Department Center  07/14/2020  8:20 AM [facility] PROVIDER [facility] None  07/23/2020 11:00 AM [facility]   V. Olmedo used for [place] interpretation.     Wendi Snipes, FNP

## 2020-06-30 NOTE — Progress Notes (Signed)
Aware of 07/03/2020 1100 [facility] [place] appt and states has appt written down at home.. Counseled to schedule next [facility] RV appt for early am as needs repeat 3 hour GTT. Test prep instructions provided with verbalized understanding by client. Jossie Ng, RN

## 2020-07-13 ENCOUNTER — Telehealth: Payer: Self-pay

## 2020-07-13 NOTE — Telephone Encounter (Signed)
Call to client with Tara Chavez and reminded of 07/15/2020 appt with arrival time of 0800. Counseled regarding 3 hour GTT test prep instructions and client verbalized understanding. Jossie Ng, RN

## 2020-07-14 ENCOUNTER — Ambulatory Visit: Payer: Self-pay | Admitting: Family Medicine

## 2020-07-14 ENCOUNTER — Other Ambulatory Visit: Payer: Self-pay

## 2020-07-14 VITALS — BP 97/62 | HR 78 | Temp 97.2°F | Wt 173.4 lb

## 2020-07-14 DIAGNOSIS — O99210 Obesity complicating pregnancy, unspecified trimester: Secondary | ICD-10-CM

## 2020-07-14 DIAGNOSIS — O99213 Obesity complicating pregnancy, third trimester: Secondary | ICD-10-CM

## 2020-07-14 DIAGNOSIS — Z23 Encounter for immunization: Secondary | ICD-10-CM

## 2020-07-14 DIAGNOSIS — O0990 Supervision of high risk pregnancy, unspecified, unspecified trimester: Secondary | ICD-10-CM

## 2020-07-14 DIAGNOSIS — O280 Abnormal hematological finding on antenatal screening of mother: Secondary | ICD-10-CM

## 2020-07-14 DIAGNOSIS — O0991 Supervision of high risk pregnancy, unspecified, first trimester: Secondary | ICD-10-CM

## 2020-07-14 DIAGNOSIS — O0993 Supervision of high risk pregnancy, unspecified, third trimester: Secondary | ICD-10-CM

## 2020-07-14 DIAGNOSIS — O24419 Gestational diabetes mellitus in pregnancy, unspecified control: Secondary | ICD-10-CM

## 2020-07-14 DIAGNOSIS — Z98891 History of uterine scar from previous surgery: Secondary | ICD-10-CM

## 2020-07-14 DIAGNOSIS — O99810 Abnormal glucose complicating pregnancy: Secondary | ICD-10-CM

## 2020-07-14 DIAGNOSIS — O09523 Supervision of elderly multigravida, third trimester: Secondary | ICD-10-CM

## 2020-07-14 LAB — HEMOGLOBIN, FINGERSTICK: Hemoglobin: 11.8 g/dL (ref 11.1–15.9)

## 2020-07-14 NOTE — Progress Notes (Signed)
Patient here for MH RV at [redacted]w[redacted]d.   [facility] [employer] number to patient to call and inquire about BTL cost.   Tdap given today, tolerated well.   Appointment reminder given [facility] on 6/13 at 1100.   Floy Sabina, RN

## 2020-07-14 NOTE — Progress Notes (Signed)
Referral for repeat c-section and BTL faxed to [facility] today 07/14/20. Fax confirmation received.   Floy Sabina, RN

## 2020-07-14 NOTE — Progress Notes (Signed)
Hgb = 11.8.  WNL. Reviewed during clinic visit.

## 2020-07-14 NOTE — Progress Notes (Signed)
   PRENATAL VISIT NOTE  Subjective:  Tara Chavez is a 40 y.o. G3P2002 at [redacted]w[redacted]d being seen today for ongoing prenatal care.  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  Patient reports no complaints.  Contractions: Irritability. Vag. Bleeding: None.  Movement: Present. Denies leaking of fluid/ROM.   The following portions of the patient's history were reviewed and updated as appropriate: allergies, current medications, past family history, past medical history, past social history, past surgical history and problem list. Problem list updated.  Objective:   Vitals:   07/14/20 0846  BP: 97/62  Pulse: 78  Temp: (!) 97.2 F (36.2 C)  Weight: 173 lb 6.4 oz (78.7 kg)    Fetal Status: Fetal Heart Rate (bpm): 154 Fundal Height: 229 cm Movement: Present     General:  Alert, oriented and cooperative. Patient is in no acute distress.  Skin: Skin is warm and dry. No rash noted.   Cardiovascular: Normal heart rate noted  Respiratory: Normal respiratory effort, no problems with respiration noted  Abdomen: Soft, gravid, appropriate for gestational age.  Pain/Pressure: Absent     Pelvic: Cervical exam deferred        Extremities: Normal range of motion.  Edema: None  Mental Status: Normal mood and affect. Normal behavior. Normal judgment and thought content.   Assessment and Plan:  Pregnancy: G3P2002 at [redacted]w[redacted]d  1. Supervision of high  risk pregnancy, antepartum Up to date Completing 3 hr today - HIV Antibody (routine testing w rflx) - RPR - Tdap vaccine greater than or equal to 7yo IM - Hemoglobin, fingerstick  2. Abnormal glucose affecting pregnancy Failed 1hr in early pregnancy Reviewed risk of GDM and likelihood of GDM Discussed possible referral to nutrition and rs for suppleis - Glucose Tolerance Test, 6 Hour  3. Obesity affecting pregnancy, antepartum TWG= 33 lb 6.4 oz (15.2 kg) which is above goal for weight and GA  4. Previous cesarean section x2: 05/02/12 LTCS & 2016 Referral form completed today for delivery plans appt Desires BTL, tried calling financial assistance but has been unable to reach them  5. Abnormal quad screen 04/08/20 Sample drawn early Declined NIPT Has f/u US on 6/23  6. Multigravida of advanced maternal age in third trimester High risk for aneuploidy   Preterm labor symptoms and general obstetric precautions including but not limited to vaginal bleeding, contractions, leaking of fluid and fetal movement were reviewed in detail with the patient. Please refer to After Visit Summary for other counseling recommendations.   Return in about 2 weeks (around 07/28/2020) for Routine prenatal care.  Future Appointments  Date Time Provider Department Center  07/23/2020 11:00 AM [facility]    Federico Flake, MD

## 2020-07-16 ENCOUNTER — Telehealth: Payer: Self-pay

## 2020-07-16 LAB — GLUCOSE TOLERANCE TEST, 6 HOUR
Glucose, 1 Hour GTT: 215 mg/dL — ABNORMAL HIGH (ref 65–199)
Glucose, 2 hour: 180 mg/dL — ABNORMAL HIGH (ref 65–139)
Glucose, 3 hour: 126 mg/dL — ABNORMAL HIGH (ref 65–109)
Glucose, GTT - Fasting: 91 mg/dL (ref 65–99)

## 2020-07-16 LAB — RPR: RPR Ser Ql: NONREACTIVE

## 2020-07-16 LAB — HIV-1/HIV-2 QUALITATIVE RNA
HIV-1 RNA, Qualitative: NONREACTIVE
HIV-2 RNA, Qualitative: NONREACTIVE

## 2020-07-16 NOTE — Telephone Encounter (Signed)
[facility] referring for Delivery Plans appointment, deries BTL at time of cs. Paper records. Called with an interpreter and left generic message for patient to call back to be scheduled.

## 2020-07-17 ENCOUNTER — Encounter: Payer: Self-pay | Admitting: Advanced Practice Midwife

## 2020-07-17 ENCOUNTER — Other Ambulatory Visit: Payer: Self-pay | Admitting: Advanced Practice Midwife

## 2020-07-17 DIAGNOSIS — O24419 Gestational diabetes mellitus in pregnancy, unspecified control: Secondary | ICD-10-CM | POA: Insufficient documentation

## 2020-07-17 DIAGNOSIS — O24435 Gestational diabetes mellitus in puerperium, controlled by oral hypoglycemic drugs: Secondary | ICD-10-CM | POA: Insufficient documentation

## 2020-07-17 MED ORDER — BLOOD GLUCOSE TEST VI STRP: 1.0000 [IU] | ORAL_STRIP | Freq: Four times a day (QID) | 12 refills | Status: DC

## 2020-07-17 MED ORDER — BLOOD GLUCOSE MONITOR KIT: PACK | 0 refills | Status: DC

## 2020-07-17 NOTE — Telephone Encounter (Signed)
T/C with [facility] interpreter Marlene Yemen to discuss abnormal 3 hour GTT on 07/14/20 and new dx of gestationa diabetes. Informed pt she will receive phone call from [facility] informing her of 2 apts she will need to attend there. She will begin checking BS 4x/day and write on paper [facility] will give her. Counseled need for weekly apts now and to bring BS paper with her to every apt. Questions answered. Glucometer, strips, and lancets ordered

## 2020-07-17 NOTE — Addendum Note (Signed)
Addended by: Geanie Berlin on: 07/17/2020 01:35 PM   Modules accepted: Orders

## 2020-07-20 ENCOUNTER — Telehealth: Payer: Self-pay

## 2020-07-20 NOTE — Telephone Encounter (Signed)
Per Epic appt desk, client has [facility] delivery plans appt 08/04/2020 at 0830. Call to client with [facility] Interpre[employer] ID # [phone] to verfiy awareness of appt. Per client, someone from office had called her with appt and client already had facility address. Jossie Ng, RN

## 2020-07-20 NOTE — Telephone Encounter (Signed)
Patient is scheduled for 08/04/20 with [facility]

## 2020-07-22 ENCOUNTER — Telehealth: Payer: Self-pay

## 2020-07-22 ENCOUNTER — Telehealth: Payer: Self-pay | Admitting: Family Medicine

## 2020-07-22 ENCOUNTER — Other Ambulatory Visit: Payer: Self-pay | Admitting: Advanced Practice Midwife

## 2020-07-22 DIAGNOSIS — O09523 Supervision of elderly multigravida, third trimester: Secondary | ICD-10-CM

## 2020-07-22 NOTE — Telephone Encounter (Signed)
Please see other phone note dated 07/22/20.Marland KitchenBurt Knack, RN

## 2020-07-22 NOTE — Telephone Encounter (Signed)
Patient has had a severe cough for the past few days and would like to know if she can take any medication for it. Her throat hurts and her stomach as well from all the coughing.

## 2020-07-22 NOTE — Telephone Encounter (Signed)
TC from patient stating she has sore throat, headache, itchy throat, and cough. Patient states she also has aches in her "womb" and when questioned about that she stated she doesn't really know where the aching is, "it's inside somewhere". Patient states coughing makes her have a contraction but she drinks water and contraction goes away. Patient states baby is moving, no bleeding, no vaginal fluid loss. Patient concerned about taking medications during pregnancy and is concerned these symptoms may be caused by Tdap she received 8 days ago. Patient counseled that current symptoms unlikely from Tdap, and that she can take Tylenol for the headache and sore throat. Also counseled to check her "safe medications for pregnancy" sheet given to her at her first appointment for medications to relieve cough. Patient states she has had 2 covid vaccines, but counseled she should be tested to rule out covid infection at this time. Patient states understanding and counseled to call if she has further questions, medicines aren't helping and/or if she is covid positive. Interpreter M. Yemen.Burt Knack, RN

## 2020-07-23 ENCOUNTER — Other Ambulatory Visit: Payer: Self-pay

## 2020-07-23 ENCOUNTER — Ambulatory Visit: Payer: Self-pay | Attending: Maternal & Fetal Medicine

## 2020-07-23 DIAGNOSIS — O09523 Supervision of elderly multigravida, third trimester: Secondary | ICD-10-CM | POA: Insufficient documentation

## 2020-07-23 DIAGNOSIS — E669 Obesity, unspecified: Secondary | ICD-10-CM | POA: Insufficient documentation

## 2020-07-23 DIAGNOSIS — O99213 Obesity complicating pregnancy, third trimester: Secondary | ICD-10-CM | POA: Insufficient documentation

## 2020-07-23 DIAGNOSIS — O24419 Gestational diabetes mellitus in pregnancy, unspecified control: Secondary | ICD-10-CM | POA: Insufficient documentation

## 2020-07-23 DIAGNOSIS — Z3A29 29 weeks gestation of pregnancy: Secondary | ICD-10-CM | POA: Insufficient documentation

## 2020-07-23 IMAGING — US US MFM OB FOLLOW-UP
1 series · 14 of 28 positions shown · non-contrast
Comparison: none

[Series 1: us mfm ob follow-up · 14 of 51 slices shown]
[im 2/51]
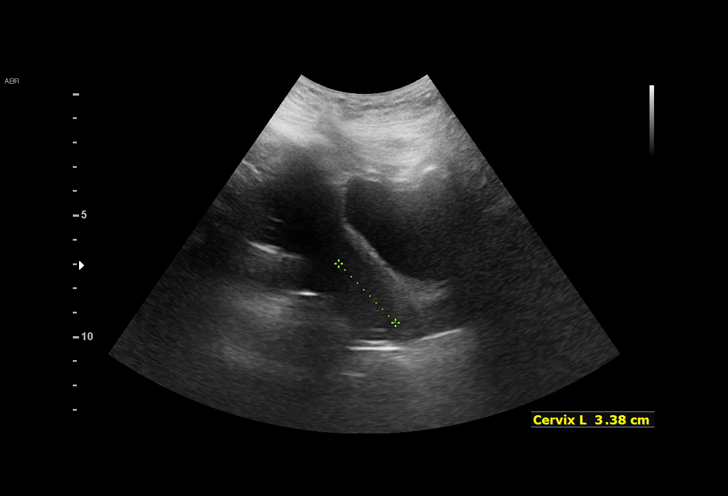
[im 6/51]
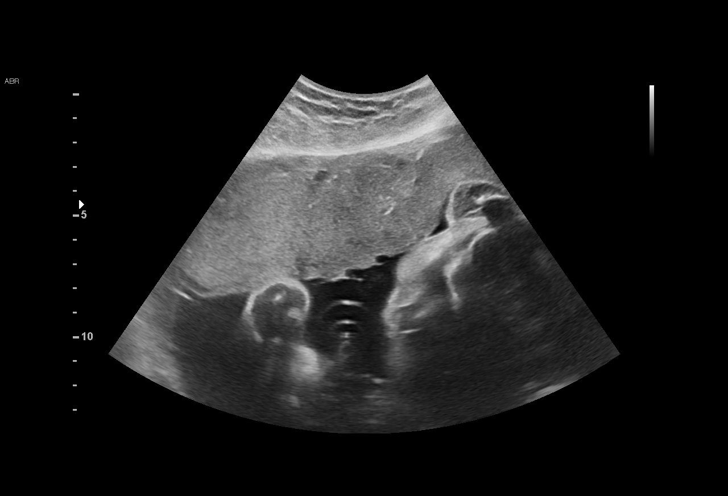
[im 10/51]
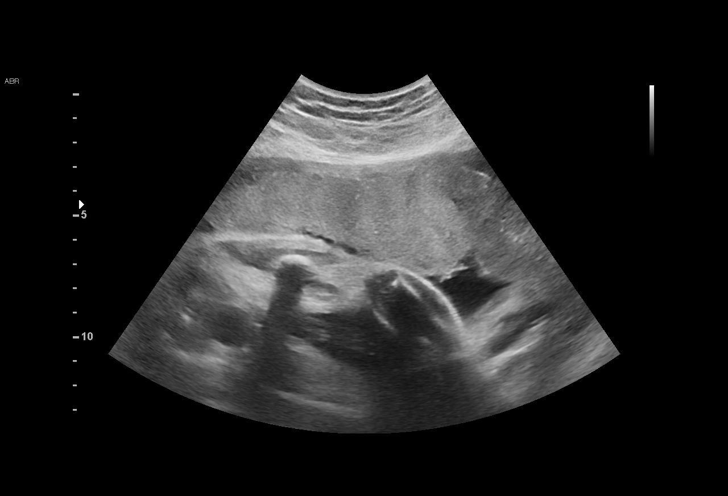
[im 13/51]
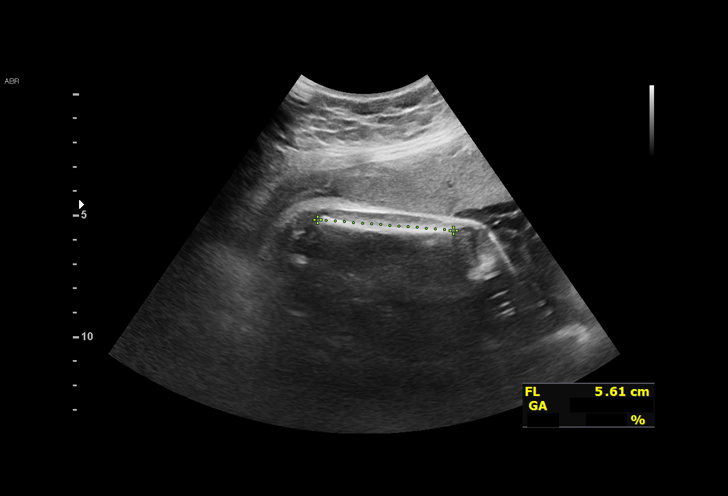
[im 17/51]
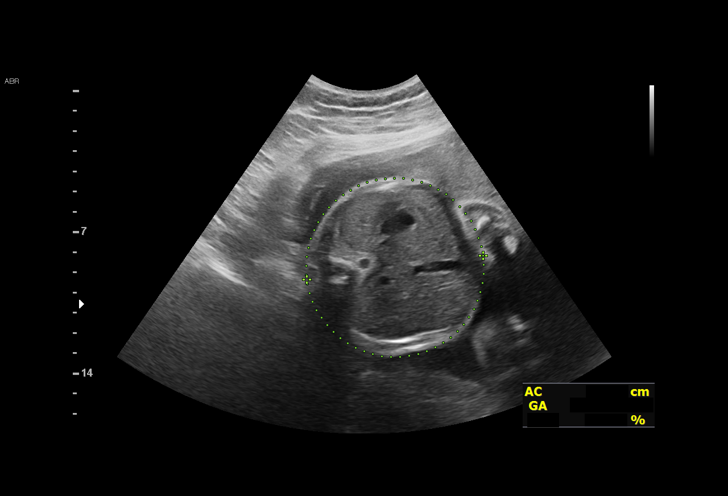
[im 21/51]
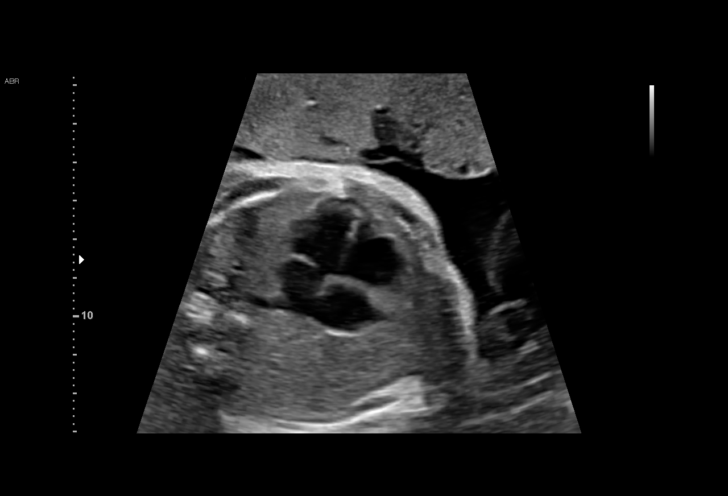
[im 25/51]
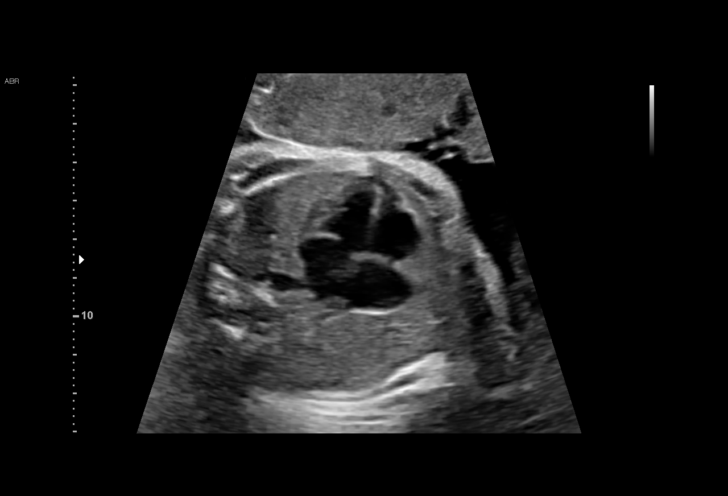
[im 28/51]
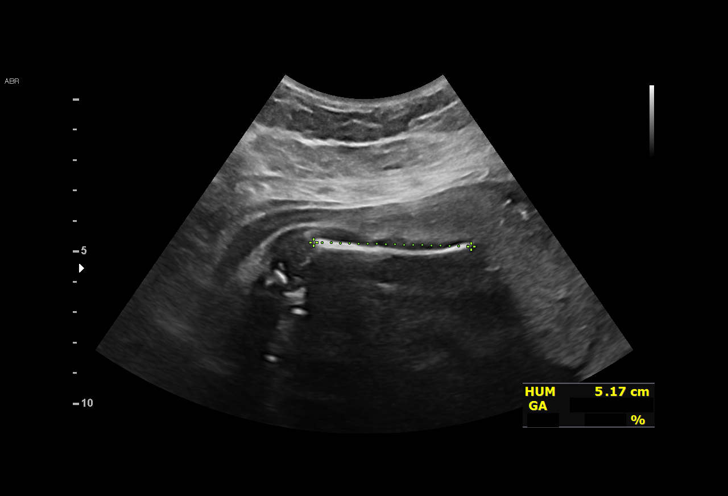
[im 32/51]
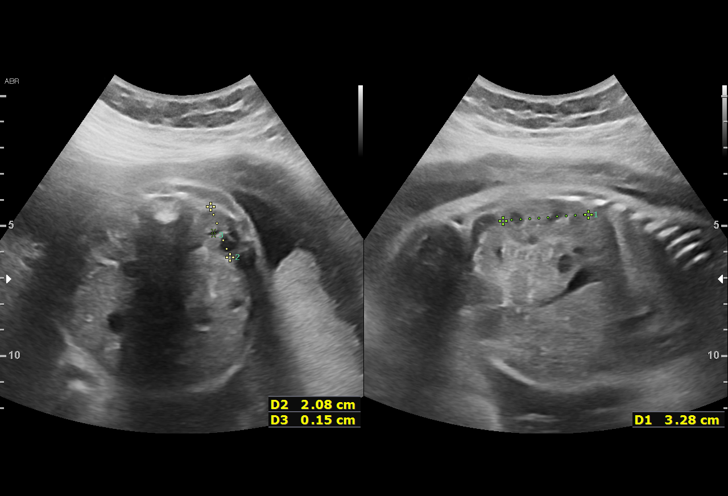
[im 36/51]
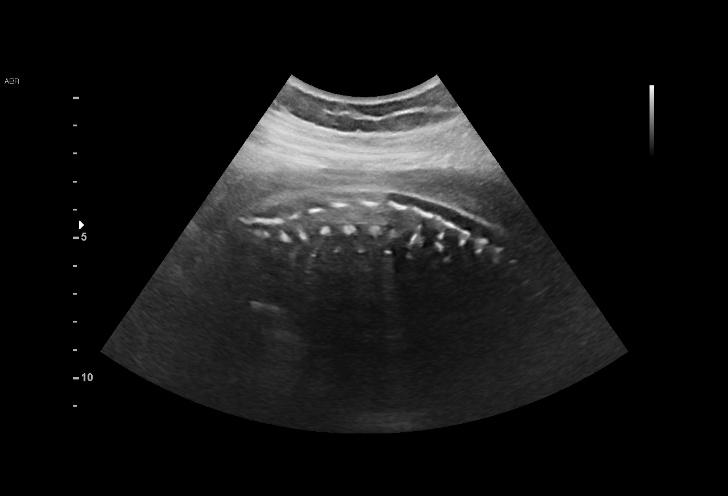
[im 39/51]
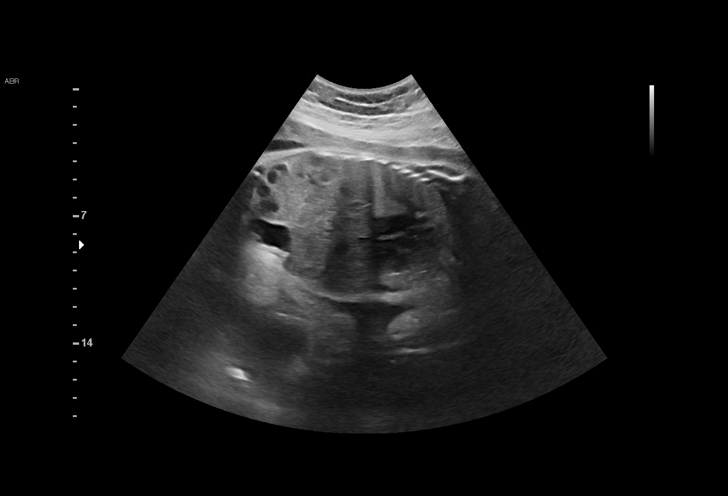
[im 43/51]
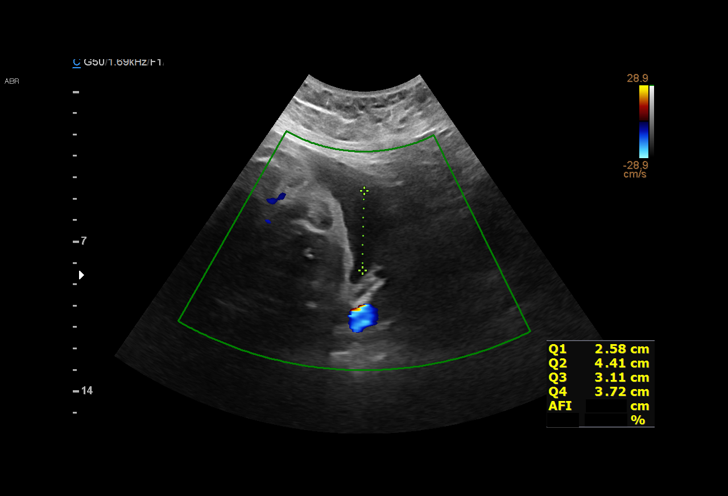
[im 47/51]
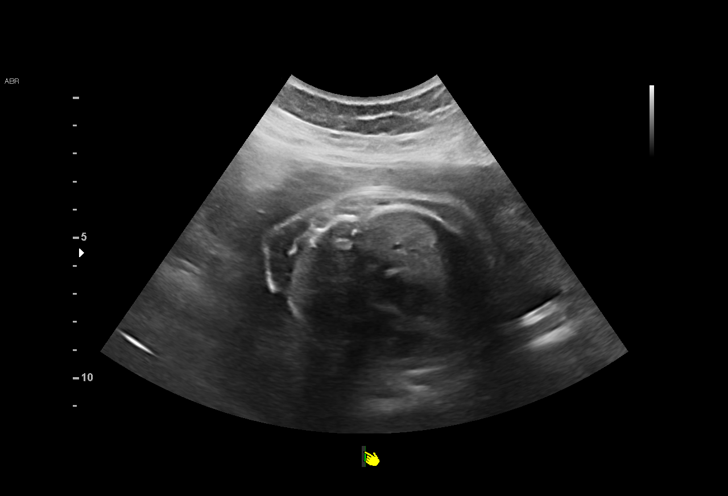
[im 51/51]
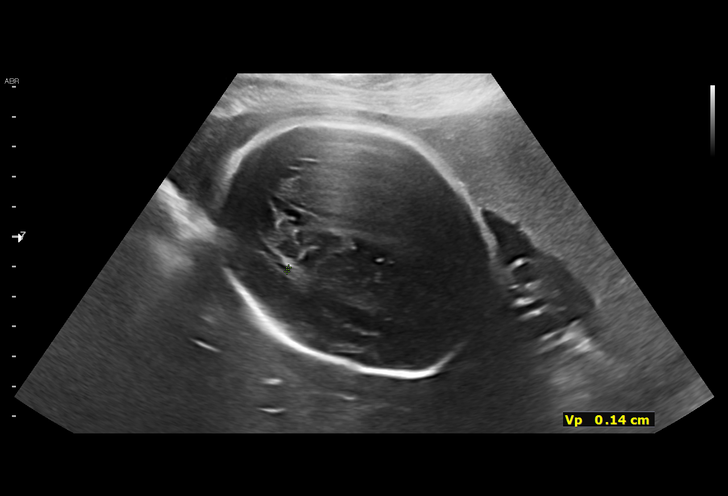

[14 of 28 positions shown; findings below may reference images not displayed]

SAMARTH

 Referred By:       APPLE TIGER
                    KLEVER CNM

Indications

 29 weeks gestation of pregnancy
 Obesity complicating pregnancy
 Advanced maternal age multigravida 33+, third
 trimester
 Gestational diabetes in pregnancy,
 unspecified control
Fetal Evaluation

 Num Of Fetuses:          1
 Fetal Heart Rate(bpm):   143
 Cardiac Activity:        Observed
 Presentation:            Cephalic
 Placenta:                Anterior
 P. Cord Insertion:       Previously Visualized

 AFI Sum(cm)     %Tile       Largest Pocket(cm)
 13.8            44

 RUQ(cm)       RLQ(cm)        LUQ(cm)        LLQ(cm)

Biometry
 BPD:      74.8   mm     G. Age:  30w 0d         42  %    CI:         66.55  %    70 - 86
                                                          FL/HC:       18.8  %    19.2 -
 HC:      293.9   mm     G. Age:  32w 3d         86  %    HC/AC:       1.07       0.99 -
 AC:      275.7   mm     G. Age:  31w 4d         90  %    FL/BPD:      73.8  %    71 - 87
 FL:       55.2   mm     G. Age:  29w 1d         17  %    FL/AC:       20.0  %    20 - 24
 HUM:      52.2   mm     G. Age:  30w 3d         62  %

 LV:         1.4  mm

 Est. FW:    8209   gm    3 lb 10 oz      71  %
Gestational Age

 LMP:            32w 6d       Date:  12/06/19                   EDD:  09/11/20
 U/S Today:      30w 6d                                         EDD:  09/25/20
 Best:           29w 6d    Det. By:  U/S  (05/12/20)            EDD:  10/02/20
Anatomy

 Cranium:                Previously seen        Aortic Arch:            Appears normal
 Cavum:                  Previously seen        Ductal Arch:            Not well visualized
 Ventricles:             Appears normal         Diaphragm:              Appears normal
 Choroid Plexus:         Previously seen        Stomach:                Appears normal, left
                                                                        sided
 Cerebellum:             Previously seen        Abdomen:                Previously seen
 Posterior Fossa:        Previously seen        Abdominal Wall:         Previously seen
 Nuchal Fold:            Previously seen        Cord Vessels:           Previously seen
 Face:                   Orbits and profile     Kidneys:                Appear normal
                         previously seen
 Lips:                   Previously seen        Bladder:                Appears normal
 Heart:                  Appears normal         Spine:                  Previously seen
                         (4CH, axis, and situs)
 RVOT:                   Appears normal         Upper Extremities:      Previously seen
 LVOT:                   Appears normal         Lower Extremities:      Previously seen

 Other:   Unable to see DA on todays exam.
Cervix Uterus Adnexa

 Cervix
 Length:            3.38  cm.
Impression

 Amniotic fluid is normal and good fetal activity is seen .Fetal
 growth is appropriate for gestational age .
 Patient has gestational diabetes.

 This study was remotely read .
Recommendations

 -An appointment was made for her to return in 4 weeks for fetal
 growth assessment.
                 Amazigh, Quirijn

## 2020-07-24 NOTE — Progress Notes (Signed)
Per [employer], client has [facility] appt for diabetes class on 07/31/2020 at 0900. Appt scheduled directly by Inetta Fermo at [facility] with client. Jossie Ng, RN

## 2020-07-27 NOTE — Addendum Note (Signed)
Addended by: Heywood Bene on: 07/27/2020 03:37 PM   Modules accepted: Orders

## 2020-07-28 ENCOUNTER — Telehealth: Payer: Self-pay

## 2020-07-28 ENCOUNTER — Other Ambulatory Visit: Payer: Self-pay

## 2020-07-28 ENCOUNTER — Encounter: Payer: Self-pay | Admitting: Family Medicine

## 2020-07-28 ENCOUNTER — Ambulatory Visit: Payer: Self-pay | Admitting: Family Medicine

## 2020-07-28 VITALS — BP 101/62 | HR 79 | Temp 98.5°F | Wt 173.3 lb

## 2020-07-28 DIAGNOSIS — O24419 Gestational diabetes mellitus in pregnancy, unspecified control: Secondary | ICD-10-CM

## 2020-07-28 DIAGNOSIS — O0990 Supervision of high risk pregnancy, unspecified, unspecified trimester: Secondary | ICD-10-CM

## 2020-07-28 DIAGNOSIS — Z98891 History of uterine scar from previous surgery: Secondary | ICD-10-CM

## 2020-07-28 DIAGNOSIS — O0993 Supervision of high risk pregnancy, unspecified, third trimester: Secondary | ICD-10-CM

## 2020-07-28 DIAGNOSIS — O99810 Abnormal glucose complicating pregnancy: Secondary | ICD-10-CM

## 2020-07-28 DIAGNOSIS — O09523 Supervision of elderly multigravida, third trimester: Secondary | ICD-10-CM

## 2020-07-28 DIAGNOSIS — O99213 Obesity complicating pregnancy, third trimester: Secondary | ICD-10-CM

## 2020-07-28 LAB — URINALYSIS
Bilirubin, UA: NEGATIVE
Glucose, UA: NEGATIVE
Ketones, UA: NEGATIVE
Leukocytes,UA: NEGATIVE
Nitrite, UA: NEGATIVE
Protein,UA: NEGATIVE
RBC, UA: NEGATIVE
Specific Gravity, UA: 1.03 (ref 1.005–1.030)
Urobilinogen, Ur: 0.2 mg/dL (ref 0.2–1.0)
pH, UA: 6 (ref 5.0–7.5)

## 2020-07-28 NOTE — Progress Notes (Signed)
PRENATAL VISIT NOTE  Subjective:  Tara Chavez is a 40 y.o. G3P2002 at [redacted]w[redacted]d being seen today for ongoing prenatal care.  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  Patient reports no complaints. Reported some drops of blood in the past 1-2 weeks, at least one time related to having sex with partner.  Contractions: Irritability.  .  Movement: Present. Denies leaking of fluid/ROM.   The following portions of the patient's history were reviewed and updated as appropriate: allergies, current medications, past family history, past medical history, past social history, past surgical history and problem list. Problem list updated.  Objective:   Vitals:   07/28/20 0830  BP: 101/62  Pulse: 79  Temp: 98.5 F (36.9 C)  Weight: 173 lb 4.8 oz (78.6 kg)    Fetal Status: Fetal Heart Rate (bpm): 135 Fundal Height: 31 cm Movement: Present     General:  Alert, oriented and cooperative. Patient is in no acute distress.  Skin: Skin is warm and dry. No rash noted.   Cardiovascular: Normal heart rate noted  Respiratory: Normal respiratory effort, no problems with respiration noted  Abdomen: Soft, gravid, appropriate for gestational age.  Pain/Pressure: Absent     Pelvic: Cervical exam deferred        Extremities: Normal range of motion.  Edema: None  Mental Status: Normal mood and affect. Normal behavior. Normal judgment and thought content.   Assessment and Plan:  Pregnancy: G3P2002 at [redacted]w[redacted]d  1. Abnormal  glucose affecting pregnancy - Urinalysis (Urine Dip)- no glucose in urine, no keytones  2. Gestational diabetes mellitus (GDM) affecting pregnancy GDM in last pregnancy and remembers how to check sugars Has [facility] appt 7/1 Reviewed checking sugars and plan to coordinate with [facility] about getting meter etc. I have sent chat message to Sharion Settler to make sure we have the patient ready for this appt.  Patient aware of need to focus on protein in diet and more frequent eating to stabilize BG  3. Supervision of high risk pregnancy, antepartum Feeling well Reports STRONG desire for BTL at the time of RCS  4. Obesity affecting pregnancy in third trimester TWG=33 lb 4.8 oz (15.1 kg)- which is very much above goal for her prepregnancy weight. Recommended increased exercise and protein in diet with minimal carbs (this will help with GDM as well)  5. Third-stage postpartum hemorrhage LD aware  6. Previous cesarean section x2: 05/02/12 LTCS & 2016 Desires RCS with BTL Has appt with [facility] to discuss  7. Multigravida of advanced maternal age in third trimester Declined NIP QUAD was elevated risk-- was drawn early US WNL Taking ASA daily Start weekly NST at 36wk RCS at 39 wks   Preterm labor symptoms and general obstetric precautions including but not limited to vaginal bleeding, contractions, leaking of fluid and fetal movement were reviewed in detail with the patient. Please refer to After Visit Summary for other counseling recommendations.  Return in about 10 days (around 08/07/2020) for Routine prenatal care, BG log after [facility] Visit.  Future Appointments  Date Time Provider Department Center  07/31/2020  9:00  AM Shotwell, Orlean Bradford, RN [facility] None  08/04/2020  8:30 AM Natale Milch, MD [facility] None  08/06/2020  8:40 AM [facility] PROVIDER [facility] None  08/25/2020  7:30 AM [facility]    Federico Flake, MD

## 2020-07-28 NOTE — Telephone Encounter (Signed)
Contacted patient and inform her that she needs to get over the counter supplied for glucose checks.   Informed her to inquire about getting a glucometer, lancets, and strips; that she can buy them over the counter at [facility] at a reasonable price. Gave patient estimated prices per [employer] -site. Suggested that if she could to have them before [facility] appointment.   All questions answered and patient agreed with plan of care. To call us for any questions. Conversation was in Actuary.   Floy Sabina, RN

## 2020-07-28 NOTE — Progress Notes (Signed)
Urine dip reviewed with provider, K. Alvester Morin, MD during clinic visit.   Floy Sabina, RN

## 2020-07-31 ENCOUNTER — Encounter: Payer: Self-pay | Attending: Family Medicine | Admitting: *Deleted

## 2020-07-31 ENCOUNTER — Other Ambulatory Visit: Payer: Self-pay

## 2020-07-31 ENCOUNTER — Encounter: Payer: Self-pay | Admitting: *Deleted

## 2020-07-31 VITALS — BP 108/66 | Ht 59.0 in | Wt 175.0 lb

## 2020-07-31 DIAGNOSIS — O24410 Gestational diabetes mellitus in pregnancy, diet controlled: Secondary | ICD-10-CM

## 2020-07-31 DIAGNOSIS — Z3A29 29 weeks gestation of pregnancy: Secondary | ICD-10-CM | POA: Insufficient documentation

## 2020-07-31 DIAGNOSIS — O24419 Gestational diabetes mellitus in pregnancy, unspecified control: Secondary | ICD-10-CM | POA: Insufficient documentation

## 2020-07-31 DIAGNOSIS — O99213 Obesity complicating pregnancy, third trimester: Secondary | ICD-10-CM | POA: Insufficient documentation

## 2020-07-31 NOTE — Patient Instructions (Signed)
Read booklet on Gestational Diabetes Follow Gestational Meal Planning Guidelines Avoid fruit juice Include 1 serving of protein with meals and snacks Check blood sugars 4 x day - before breakfast and 2 hrs after every meal and record  Bring blood sugar log to all MD appointments Walk 20-30 minutes at least 5 x week if permitted by MD

## 2020-07-31 NOTE — Progress Notes (Signed)
Diabetes Self-Management Education  Visit Type: First/Initial  Appt. Start Time: 0840 Appt. End Time: 1015  07/31/2020  [name], identified by name and date of birth, is a 40 y.o. female with a diagnosis of Diabetes: Gestational Diabetes.   ASSESSMENT  Blood pressure 108/66, height 4\' 11"  (1.499 m), weight 175 lb (79.4 kg), last menstrual period 12/06/2019, estimated date of delivery 10/02/2020 Body mass index is 35.35 kg/m.   Diabetes Self-Management Education - 07/31/20 1051       Visit Information   Visit Type First/Initial      Initial Visit   Diabetes Type Gestational Diabetes    Are you currently following a meal plan? Yes    What type of meal plan do you follow? "more healthy"    Are you taking your medications as prescribed? Yes    Date Diagnosed 4 weeks      Health Coping   How would you rate your overall health? Fair      Psychosocial Assessment   Patient Belief/Attitude about Diabetes Other (comment)   "normal"   Self-care barriers English as a second language    Self-management support Doctor's office    Other persons present Interpreter   via language line 10/01/20 [phone]   Patient Concerns Nutrition/Meal planning;Healthy Lifestyle;Glycemic Control    Special Needs Other (comment)   Educational materials in Spanish   Preferred Learning Style Other (comment)   talking, listening   Learning Readiness Change in progress    How often do you need to have someone help you when you read instructions, pamphlets, or other written materials from your doctor or pharmacy? 1 - Never   if written in Spanish   What is the last grade level you completed in school? junior in high school in [id number]      Pre-Education Assessment   Patient understands the diabetes disease and treatment process. Needs Instruction    Patient understands incorporating nutritional management into lifestyle. Needs Instruction    Patient undertands incorporating physical activity  into lifestyle. Needs Review    Patient understands using medications safely. Needs Instruction    Patient understands monitoring blood glucose, interpreting and using results Needs Instruction    Patient understands prevention, detection, and treatment of acute complications. Needs Instruction    Patient understands prevention, detection, and treatment of chronic complications. Needs Instruction    Patient understands how to develop strategies to address psychosocial issues. Needs Instruction    Patient understands how to develop strategies to promote health/change behavior. Needs Instruction      Complications   Last HgB A1C per patient/outside source 5.3 %   04/08/2020   How often do you check your blood sugar? 0 times/day (not testing)   She brought her glucometer (Accu-Chek Guide Me) to this appointment and was instructed on use. BG upon return demonstration was 90 mg/dL at 06/08/2020 am - fasting.   Have you had a dilated eye exam in the past 12 months? No    Have you had a dental exam in the past 12 months? No    Are you checking your feet? No      Dietary Intake   Breakfast oatmeal and brown bread; 2 hard boiled eggs    Lunch same as breakfast    Dinner chicken, steak, has pork and fish 1 x week; low carb tortilla, fajita with vegetables, rice, beans; broccoli, tomatoes, carrots, lettuce, green beans, avocado    Beverage(s) water, juice milk  Exercise   Exercise Type Light (walking / raking leaves)    How many days per week to you exercise? 2.5    How many minutes per day do you exercise? 25    Total minutes per week of exercise 62.5      Patient Education   Previous Diabetes Education Yes (please comment)   6 years ago at [facility] - had previous GDM   Disease state  Definition of diabetes, type 1 and 2, and the diagnosis of diabetes;Factors that contribute to the development of diabetes    Nutrition management  Role of diet in the treatment of diabetes and the relationship  between the three main macronutrients and blood glucose level;Food label reading, portion sizes and measuring food.;Reviewed blood glucose goals for pre and post meals and how to evaluate the patients' food intake on their blood glucose level.    Physical activity and exercise  Role of exercise on diabetes management, blood pressure control and cardiac health.    Medications Other (comment)   Limited use of oral medications during pregnancy and potential for insulin   Monitoring Taught/evaluated SMBG meter.;Purpose and frequency of SMBG.;Taught/discussed recording of test results and interpretation of SMBG.;Identified appropriate SMBG and/or A1C goals.    Acute complications Taught treatment of hypoglycemia - the 15 rule.    Chronic complications Relationship between chronic complications and blood glucose control    Psychosocial adjustment Identified and addressed patients feelings and concerns about diabetes    Preconception care Pregnancy and GDM  Role of pre-pregnancy blood glucose control on the development of the fetus;Reviewed with patient blood glucose goals with pregnancy;Role of family planning for patients with diabetes      Individualized Goals (developed by patient)   Reducing Risk Other (comment)   improve blood sugars, lead a healthier life     Outcomes   Expected Outcomes Demonstrated interest in learning. Expect positive outcomes    Future DMSE PRN    Program Status Not Completed             Individualized Plan for Diabetes Self-Management Training:   Learning Objective:  Patient will have a greater understanding of diabetes self-management. Patient education plan is to attend individual and/or group sessions per assessed needs and concerns.   Plan:   Patient Instructions  Read booklet on Gestational Diabetes Follow Gestational Meal Planning Guidelines Avoid fruit juice Include 1 serving of protein with meals and snacks Check blood sugars 4 x day - before breakfast  and 2 hrs after every meal and record  Bring blood sugar log to all MD appointments Walk 20-30 minutes at least 5 x week if permitted by MD  Expected Outcomes:  Demonstrated interest in learning. Expect positive outcomes  Education material provided:  Gestational Booklet (Spanish) Gestational Meal Planning Guidelines (Spanish) Simple Meal Plan (Spanish) Goals for a Healthy Pregnancy (Spanish)   If problems or questions, patient to contact team via:  Sharion Settler, RN, CCM, CDCES [phone]  Future DSME appointment: PRN The patient doesn't have insurance and doesn't want to return for a visit with the dietitian. She had Gestational diabetes 6 years ago and received education.

## 2020-08-04 ENCOUNTER — Ambulatory Visit (INDEPENDENT_AMBULATORY_CARE_PROVIDER_SITE_OTHER): Payer: Self-pay | Admitting: Obstetrics and Gynecology

## 2020-08-04 ENCOUNTER — Encounter: Payer: Self-pay | Admitting: Obstetrics and Gynecology

## 2020-08-04 ENCOUNTER — Other Ambulatory Visit: Payer: Self-pay

## 2020-08-04 VITALS — BP 112/72 | Ht 59.0 in | Wt 175.6 lb

## 2020-08-04 DIAGNOSIS — O24419 Gestational diabetes mellitus in pregnancy, unspecified control: Secondary | ICD-10-CM

## 2020-08-04 DIAGNOSIS — Z3009 Encounter for other general counseling and advice on contraception: Secondary | ICD-10-CM

## 2020-08-04 DIAGNOSIS — Z98891 History of uterine scar from previous surgery: Secondary | ICD-10-CM

## 2020-08-04 DIAGNOSIS — O34219 Maternal care for unspecified type scar from previous cesarean delivery: Secondary | ICD-10-CM

## 2020-08-04 DIAGNOSIS — O0990 Supervision of high risk pregnancy, unspecified, unspecified trimester: Secondary | ICD-10-CM

## 2020-08-04 NOTE — Patient Instructions (Addendum)
Cesarean section with tubal ligation scheduled for 09/29/2020  Parto por cesrea Cesarean Delivery El nacimiento o parto por cesrea es el parto quirrgico de un beb a Annette Stable de una incisin en el abdomen y Careers information officer. Se lo conoce como cesrea. Este procedimiento se puede programar con anticipacin o se puede Academic librarian. Informe al mdico acerca de lo siguiente: Cualquier alergia que tenga. Todos los [employer], incluidos vitaminas, hierbas, gotas oftlmicas, cremas y [address] de [address]. Cualquier problema previo que usted o algn miembro de su familia haya tenido con los anestsicos. Cualquier trastorno de la sangre que tenga. Cirugas a las que se haya sometido. Cualquier afeccin mdica que tenga. Si usted o Research scientist (physical sciences) tiene antecedentes de trombosis venosa profunda (TVP) o embolia pulmonar (EP). Cules son los riesgos? En general, se trata de un procedimiento seguro. Sin embargo, pueden ocurrir complicaciones, por ejemplo: Infeccin. Sangrado. Reacciones alrgicas a los medicamentos. Daos a otras estructuras u rganos. Cogulos de [place]. Lesiones al beb. Qu ocurre antes del procedimiento? Indicaciones generales Siga las indicaciones del mdico respecto de las restricciones en las comidas o las bebidas. Si sabe que va a tener un parto por cesrea, no se rasure la zona pbica. Rasurarse antes del procedimiento puede aumentar el riesgo de infeccin. Pdale a alguien que la lleve a su casa desde el hospital. Pregntele al mdico qu medidas se tomarn para prevenir una infeccin. Estas pueden incluir: Rasurar el vello del lugar de la ciruga. Lavar la piel con un jabn antisptico. Suministrar antibiticos. Segn el motivo del parto por cesrea, es posible que se le realice un examen fsico o una prueba adicional, como una ecografa. Posiblemente le realicen un anlisis de [place]. Preguntas para hacerle al  mdico Consulte al mdico sobre: Multimedia programmer o suspender los medicamentos que toma habitualmente. Esto es muy importante si toma medicamentos para la diabetes o anticoagulantes. El plan para Human resources officer. Esto es muy importante si piensa amamantar a su beb. Cunto Physicist, medical hospital despus del procedimiento. Cualquier inquietud que pueda tener sobre recibir hemoderivados, si los necesita durante el procedimiento. Almacenamiento de sangre del cordn, si planea guardar la sangre del cordn umbilical del beb. Quiz tambin desee preguntarle al mdico lo siguiente: Si va a poder Publishing rights manager a su beb mientras an se encuentre en el quirfano. Si su beb puede quedarse con usted inmediatamente despus del procedimiento y durante su recuperacin. Si un familiar o la persona que usted elija puede ingresar al quirfano y Personal assistant con usted durante el procedimiento, inmediatamente despus de este y durante su recuperacin. Qu ocurre durante el procedimiento?  Le colocarn una va intravenosa en una vena. Le administrarn lquido y [address], tales como antibiticos, antes de la [place]. Le colocarn monitores fetales en el abdomen para controlar la frecuencia cardaca de su beb. Se le puede entregar una bata especial de calentamiento para mantener estable la temperatura corporal. Se le puede insertar un catter en la vejiga a travs de la uretra. Esto se hace para drenar la orina durante el procedimiento. Pueden administrarle uno o ms de los siguientes medicamentos: Un medicamento para adormecer la zona (anestesia local). Un medicamento que la har dormir (anestesia general). Un medicamento (anestesia regional) que se le inyecta en la espalda o a travs de un tubo pequeo y delgado que se le coloca en la espalda (anestesia raqudea o anestesia epidural). Esto adormece la parte del cuerpo que est por debajo del sitio de la  inyeccin y Environmental consultantle permite permanecer despierta  durante el procedimiento. Si llega a sentir nuseas, dgaselo al mdico. Tendr medicamentos disponibles para ayudarla a reducir cualquier nusea que pueda sentir. Le harn una incisin en el abdomen y Duke Energyluego en el tero. Si est despierta durante el procedimiento, puede sentir tirones en el abdomen, pero no debera Financial risk analystsentir dolor. Si siente dolor, dgaselo al mdico inmediatamente. Se sacar al beb del tero. Es posible que sienta ms presin o un tirn, mientras esto sucede. Inmediatamente despus del nacimiento, se secar al beb y se lo mantendr caliente. Podr sostener y Museum/gallery exhibitions officeramamantar a su beb. Durante este momento, es posible que se pince y corte el cordn umbilical. Esto ocurre por lo general despus de un perodo de 1 a 2 minutos despus del parto. Se le extraer la placenta del tero. Las incisiones se cerrarn con puntos (suturas). Es posible que se apliquen grapas, goma para cerrar la piel o tiras [phone] en la incisin del abdomen. Pueden colocarle vendas (vendajes) sobre la incisin del abdomen. El procedimiento puede variar segn el mdico y el hospital. Ladell HeadsQu ocurre despus del procedimiento? Le controlarn la presin arterial, la frecuencia cardaca, la frecuencia respiratoria y Air cabin crewel nivel de oxgeno en la sangre hasta que le den el alta del hospital. Puede seguir recibiendo lquidos o medicamentos por va intravenosa. Sentir un poco de Engineer, miningdolor. Tendr analgsicos disponibles para ayudarla a Human resources officercontrolar el dolor. Para evitar la formacin de cogulos de sangre: Se le pueden administrar medicamentos. Quizs deba usar medias o dispositivos de compresin. Se le indicar que camine cuando pueda hacerlo. El personal del hospital alentar y apoyar que cree lazos con su beb. En el hospital, es posible que le permitan que el beb permanezca en la misma habitacin que usted (internacin conjunta) durante la hospitalizacin para fomentar la creacin del vnculo y un amamantamiento exitoso. Se le  puede sugerir que tosa y respire profundamente con frecuencia. Esto ayuda a evitar problemas pulmonares. Si tiene un catter Wal-Martque le drena la orina, se le quitar lo antes posible despus del procedimiento. Resumen El nacimiento o parto por cesrea es el parto quirrgico de un beb a travs de una incisin en el abdomen y Careers information officerel tero. Siga las indicaciones del mdico con respecto a las restricciones de comidas o bebidas antes del procedimiento. Sentir algo de dolor despus del procedimiento. Tendr analgsicos disponibles para ayudarla a Human resources officercontrolar el dolor. El personal del hospital alentar y apoyar que cree lazos con su beb despus del procedimiento. En el hospital, es posible que le permitan que el beb permanezca en la misma habitacin que usted (internacin conjunta) durante la hospitalizacin para fomentar la creacin del vnculo y un amamantamiento exitoso. Esta informacin no tiene Theme park managercomo fin reemplazar el consejo del mdico. Asegresede hacerle al mdico cualquier pregunta que tenga. Document Revised: 11/13/2019 Document Reviewed: 08/31/2017 Elsevier Patient Education  2022 Elsevier Inc. [place]udson[place]igadura de trompas posparto Postpartum Tubal Ligation La ligadura de trompas posparto (LTPP) es un procedimiento para cerrar las trompas de [place]. Esto se hace para evitar un embarazo. Cuando las trompas de NordstromFalopio se Music therapistcierran, los vulos que United States Steel Corporationliberan los ovarios no pueden ingresar al tero, y los espermatozoides no Tree surgeonpueden llegar a los vulos. La LTPP se realiza de inmediato despus del nacimiento, o 1 o 2 das despus del nacimiento, antes de que el tero regrese a su posicin normal. Si le hacen una cesrea, se la puede Education officer, environmentalrealizar al mismo tiempo que el procedimiento. No tendr que quedarse mstiempo en el hospital si se realiza  este procedimiento despus del nacimiento. La LTPP a veces se conoce como "atadura de trompas". No debe realizarse este procedimiento si quiere volver a quedar embarazada o si no est  segura de sidesea tener ms hijos. Informe al mdico acerca de lo siguiente: Cualquier alergia que tenga. Todos los [employer] [place], incluidos vitaminas, hierbas, gotas oftlmicas, cremas y [address] de [address]. Problemas previos que usted o algn miembro de su familia hayan tenido con los anestsicos. Cualquier trastorno de la sangre que tenga. Cirugas a las que se haya sometido. Cualquier afeccin que tenga o haya tenido. Cualquier embarazo anterior. Cules son los riesgos? En general, se trata de un procedimiento seguro. Sin embargo, pueden ocurrir complicaciones, por ejemplo: Infeccin. Sangrado. Daos en otros rganos del abdomen. Efectos secundarios de los [employer]. Rayna Sexton del procedimiento. Si esto ocurre, podra quedar embarazada. Embarazo ectpico. Se trata de un embarazo en el que el vulo se fija fuera del tero. Qu ocurre antes del procedimiento? Consulte al mdico si debe hacer o no lo siguiente: El grado de dolor que probablemente sienta. Los medicamentos que recibir para Chief Technology Officer, especialmente si est amamantando. Qu ocurre durante el procedimiento? Si tuvo un parto vaginal: Le colocarn una va intravenosa (i.v.) en una vena. Le administrarn uno o ms de los siguientes medicamentos: Un medicamento para ayudar a Lexicographer (sedante). Un medicamento para adormecer la zona (anestesia local). Un medicamento que la har dormir (anestesia general). Un medicamento que se inyecta en una zona del cuerpo para adormecer toda la regin que se encuentra por debajo del sitio de la inyeccin (anestesia regional). Si le administran anestesia general, le introducirn un tubo en la garganta para ayudarla a respirar. Es posible que le vacen la vejiga con una pequea sonda (catter). Le harn una incisin justo por debajo del ombligo. A travs de la incisin, se localizarn y [employer] trompas de [place]. Las trompas se atarn o Adult nurse  (cauterizarn), o se obstruirn con un clip, un anillo o una abrazadera. Es posible que se extraiga una parte pequea en el centro de cada trompa de [place]. La incisin se cerrar con puntos (suturas). Se colocar una venda (vendaje) sobre la incisin. Si tuvo un parto por cesrea: La ligadura de trompas se har a travs de la misma incisin del parto por cesrea. La incisin se cerrar con suturas. Se colocar una venda sobre la incisin. El procedimiento puede variar segn el mdico y el hospital. Ladell Heads ocurre despus del procedimiento? Le controlarn la presin arterial, la frecuencia cardaca, la frecuencia respiratoria y el nivel de oxgeno en la sangre hasta que se vaya del hospital. Conley Rolls darn analgsicos si los necesita. La alentarn para que se levante antes y camine para evitar la formacin de cogulos de [place]. Si le administraron un sedante durante el procedimiento, puede afectarlo por varias horas. No conduzca ni opere maquinaria hasta que el mdico le indique que es seguro [place]. Resumen La ligadura de trompas posparto es un procedimiento en el que las trompas de [place] se cierran para Catering manager. Este procedimiento se realiza mientras se encuentra en el hospital despus del nacimiento. Si le hacen una cesrea, el procedimiento se puede Education officer, environmental al [employer]. No tendr que quedarse ms tiempo en el hospital si se realiza este procedimiento despus del nacimiento. La ligadura de trompas posparto se [employer]. No debe realizarse este procedimiento si quiere volver a quedar embarazada o si no est segura de si desea tener ms hijos. Pregntele al mdico si este procedimiento  es adecuado para usted. Esta informacin no tiene Theme park manager el consejo del mdico. Asegresede hacerle al mdico cualquier pregunta que tenga. Document Revised: 11/26/2019 Document Reviewed: 11/26/2019 Elsevier Patient Education  2022 [employer].

## 2020-08-04 NOTE — Progress Notes (Signed)
Patient ID: Tara Chavez, female   DOB: 1980/02/27, 40 y.o.   MRN: [id number]  Reason for Consult: Routine Prenatal Visit   Referred by Caren Macadam,*  Subjective:     HPI:  Tara Chavez is a 40 y.o. female she is here today for a delivery consultation from the health department.  Her pregnancy has been complicated by gestational diabetes.  She reports that she has been checking her sugars.  She generally has fastings that are in the 90s.  Her after meals will vary from 120-130.  She does not currently require medications for control of her diabetes.  She reports normal fetal movement.  She denies any leakage of fluid or bleeding.  She has occasional uterine cramping.  She reports that her first and second C-section she both had for failure to dilate.  She had an unsuccessful trial of labor after C-section with her second pregnancy.  Of significance she also had abnormal antenatal testing, however it was determined that the test was collected too early and she declined further testing.  This pregnancy was dated by a 19-week ultrasound.  Additionally she reports that she would like to have a tubal ligation.  She is done with childbearing.  Pregnancy 2022 Problems (from 04/07/20 to present)     Problem Noted Resolved   Gestational diabetes mellitus (GDM) affecting pregnancy 07/17/2020 by Herbie Saxon, CNM No   Overview Addendum 07/17/2020  1:33 PM by Caren Macadam, MD    Current Diabetic Medications:  None as of 6/17  [x ]ordered meter, strips, lancets- Date: 07/17/20  Diabetic Teaching for A1GDM or A2GDM: [x]  [facility] - sent 6/17   Baseline and surveillance labs (pulled in from [facility], refresh links as needed)  Lab Results  Component Value Date   CREATININE 0.51 (L) 05/01/2020   AST 17 05/01/2020   ALT 14 05/01/2020   TSH 1.990 05/01/2020   Lab Results  Component Value Date   HGBA1C 5.3 04/08/2020   HGBA1C 5.6%  03/28/2018    Antenatal Testing Class of DM U/S NST/AFI DELIVERY  Diabetes   A1 - good control - O24.410    A2 - good control - O24.419      A2  - poor control or poor compliance - O24.419, E11.65   (Macrosomia or polyhydramnios) **E11.65 is extra code for poor control**      20-36  20-36  20-24-28-32-36     40  32//2 x wk  32//2 x wk     40  39  PRN      [ ]  referred for XX week [place] on - (insert date) [ ]  Results reviewed and EFW=   Postpartum:  [ ]  2 hr GTT needed [ ]  Counsel patient on increased lifetime risk of diabetes and recommend regular DM screeing        Supervision of high risk pregnancy, antepartum 04/08/2020 by Herbie Saxon, CNM No   Overview Addendum 07/28/2020  9:20 AM by Caren Macadam, MD     Nursing Staff Provider  Office Location  [facility] Dating  U/S- 05/12/20 @19  w4d  Language  Spanish Anatomy US  06/09/20-43w1dcervical length 4.7 cm, normal growth with measurements consistent with dates.  F/U growth scheduled for 6 weeks.  initiate weekly testing @ 36 weeks   Flu Vaccine  Declined -  04/08/2020, 05/01/20 Genetic Screen  NIPS:   AFP:   First Screen:  Quad:  Abnormal Quad,  *quad drawn too soon,  declines additional testing   TDaP vaccine    Hgb A1C or  GTT Early 1 hr = 162; 3hr gtt = WNL Third trimester: 3hr gtt = Elevated (07/14/20)   COVID vaccine Pfizer  (10/21/19,  11/11/2019)    Rhogam  NA   LAB RESULTS   Feeding Plan  breast and formula Blood Type   O positive  04/08/20  Contraception BTS Antibody   Negative 04/08/20  Circumcision  Rubella  Immune by titer,     10/13/2011    Pediatrician  [facility]   Non reactive 04/08/20  Support Person  HBsAg    Negative  04/08/20   Prenatal Classes  HIV   Non-reactive           (04/08/2020)  @28wk - Doula referral?  Varicella Immune by titer,     10/13/2011    HCV    Negative   04/08/20  BTL Consent  NA-- not covered by Medicaid-- DESIRES BTS GBS  (For PCN allergy, check  sensitivities)        VBAC Consent  Pap  03/06/17 neg HPV neg; 04/08/20=neg    Hgb Electro  Normal Hgb,        10/13/2011  BP Cuff ordered  CF   Delivery Group  [facility]   Centering Group  OBCM involved          Abnormal glucose affecting pregnancy 04/08/20 1 hour=162 04/09/2020 by Rich Number, RN 07/17/2020 by Caren Macadam, MD   Overview Addendum 04/17/2020  5:13 PM by Junious Dresser, FNP    1 hr = 162, needs 3 hour GTT=04/14/20= WNL            Gynecological History  Patient's last menstrual period was 12/06/2019 (approximate). M OB History  Gravida Para Term Preterm AB Living  3 2 2  0 0 2  SAB IAB Ectopic Multiple Live Births  0 0 0 0 2    # Outcome Date GA Lbr Len/2nd Weight Sex Delivery Anes PTL Lv  3 Current           2 Term 07/21/14 [redacted]w[redacted]d 8 lb 8.3 oz (3.864 kg) F CS-LTranv EPI  LIV     Birth Comments: Per [facility] notes in Epic: protracted labor course and chorioamnionitis  1 Term 05/21/12 435w1d7 lb 10 oz (3.459 kg) M CS-LTranv  N LIV     Birth Comments: Active phase, arrest 8cm, nonreassuring fetal monitoring in paperchart.  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"   Family History  Problem Relation Age of Onset   Heart disease Mother    Kidney disease Mother    Hypertension Mother    Hypertension Father    Angina Father    Migraines Sister    Multiple births Paternal Aunt        twins   Cancer Maternal Grandmother        unsure of kind-had surgery   Multiple births Paternal Aunt        triplets   Past Surgical History:  Procedure Laterality Date  CESAREAN SECTION      Short Social History:  Social History    Tobacco Use   Smoking status: Former    Pack years: 0.00   Smokeless tobacco: Never   Tobacco comments:    quit about 2011, denies secondhand smoke  Substance Use Topics   Alcohol use: Not Currently    Comment: "nothing in 10 years"    No Known Allergies  Current Outpatient Medications  Medication Sig Dispense Refill   acetaminophen (TYLENOL) 325 MG tablet Take 650 mg by mouth every 6 (six) hours as needed. Unsure of dose - to notify clinic at next appt of medicine strength.     aspirin EC 81 MG tablet Take 81 mg by mouth daily. Swallow whole.     blood glucose meter kit and supplies KIT Dispense based on patient and insurance preference. Use up to four times daily as directed. 1 each 0   Glucose Blood (BLOOD GLUCOSE TEST STRIPS) STRP 1 Units by In Vitro route in the morning, at noon, in the evening, and at bedtime. 120 strip 12   Prenatal Vit-Fe Fumarate-FA (MULTIVITAMIN-PRENATAL) 27-0.8 MG TABS tablet Take 1 tablet by mouth daily at 12 noon.     No current facility-administered medications for this visit.    Review of Systems  Constitutional: Negative for chills, fatigue, fever and unexpected weight change.  HENT: Negative for trouble swallowing.  Eyes: Negative for loss of vision.  Respiratory: Negative for cough, shortness of breath and wheezing.  Cardiovascular: Negative for chest pain, leg swelling, palpitations and syncope.  GI: Negative for abdominal pain, blood in stool, diarrhea, nausea and vomiting.  GU: Negative for difficulty urinating, dysuria, frequency and hematuria.  Musculoskeletal: Negative for back pain, leg pain and joint pain.  Skin: Negative for rash.  Neurological: Negative for dizziness, headaches, light-headedness, numbness and seizures.  Psychiatric: Negative for behavioral problem, confusion, depressed mood and sleep disturbance.       Objective:  Objective   Vitals:   08/04/20 0837  BP: 112/72  Weight: 175 lb 9.6 oz (79.7 kg)  Height: 4'  11" (1.499 m)   Body mass index is 35.47 kg/m.  Physical Exam Vitals and nursing note reviewed. Exam conducted with a chaperone present.  Constitutional:      Appearance: Normal appearance.  HENT:     Head: Normocephalic and atraumatic.  Eyes:     Extraocular Movements: Extraocular movements intact.     Pupils: Pupils are equal, round, and reactive to light.  Cardiovascular:     Rate and Rhythm: Normal rate and regular rhythm.  Pulmonary:     Effort: Pulmonary effort is normal.     Breath sounds: Normal breath sounds.  Abdominal:     General: Abdomen is flat.     Palpations: Abdomen is soft.  Musculoskeletal:     Cervical back: Normal range of motion.  Skin:    General: Skin is warm and dry.  Neurological:     General: No focal deficit present.     Mental Status: She is alert and oriented to person, place, and time.  Psychiatric:        Behavior: Behavior normal.        Thought Content: Thought content normal.        Judgment: Judgment normal.  Fetal heart rate: 140 bpm  Assessment/Plan:    39 year old G3, P2 at 23 weeks 4 days gestational age I will plan for 39-week repeat cesarean section with tubal ligation. I discussed risks of  the C-section risk of tubal ligation failure with the patient. I scheduled this for 09/29/2020 with labor and delivery and sent a note to our surgical scheduler. Discussed that if her glucose levels become uncontrolled and earlier delivery may be indicated.  Please continue with antenatal testing, routine follow-up and growth ultrasounds with the patient.  More than 30 minutes were spent face to face with the patient in the room, reviewing the medical record, labs and images, and coordinating care for the patient. The plan of management was discussed in detail and counseling was provided.      Adrian Prows MD [facility], [facility] 08/04/2020 8:56 AM

## 2020-08-06 ENCOUNTER — Ambulatory Visit: Payer: Self-pay | Admitting: Family Medicine

## 2020-08-06 ENCOUNTER — Other Ambulatory Visit: Payer: Self-pay

## 2020-08-06 VITALS — BP 99/57 | HR 71 | Temp 97.5°F | Wt 174.4 lb

## 2020-08-06 DIAGNOSIS — O09523 Supervision of elderly multigravida, third trimester: Secondary | ICD-10-CM

## 2020-08-06 DIAGNOSIS — O0990 Supervision of high risk pregnancy, unspecified, unspecified trimester: Secondary | ICD-10-CM

## 2020-08-06 DIAGNOSIS — Z98891 History of uterine scar from previous surgery: Secondary | ICD-10-CM

## 2020-08-06 DIAGNOSIS — O24419 Gestational diabetes mellitus in pregnancy, unspecified control: Secondary | ICD-10-CM

## 2020-08-06 DIAGNOSIS — O99213 Obesity complicating pregnancy, third trimester: Secondary | ICD-10-CM

## 2020-08-06 LAB — URINALYSIS
Bilirubin, UA: NEGATIVE
Glucose, UA: NEGATIVE
Ketones, UA: NEGATIVE
Leukocytes,UA: NEGATIVE
Nitrite, UA: NEGATIVE
Protein,UA: NEGATIVE
RBC, UA: NEGATIVE
Specific Gravity, UA: 1.02 (ref 1.005–1.030)
Urobilinogen, Ur: 0.2 mg/dL (ref 0.2–1.0)
pH, UA: 6 (ref 5.0–7.5)

## 2020-08-06 NOTE — Progress Notes (Signed)
   PRENATAL VISIT NOTE  Subjective:  Tara Chavez is a 40 y.o. G3P2002 at [redacted]w[redacted]d being seen today for ongoing prenatal care.  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  Patient reports no complaints.  Contractions: Not present. Vag. Bleeding: None.  Movement: Present. Denies leaking of fluid/ROM.   The following portions of the patient's history were reviewed and updated as appropriate: allergies, current medications, past family history, past medical history, past social history, past surgical history and problem list. Problem list updated.  Objective:   Vitals:   08/06/20 0828  BP: (!) 99/57  Pulse: 71  Temp: (!) 97.5 F (36.4 C)  Weight: 174 lb 6.4 oz (79.1 kg)    Fetal Status: Fetal Heart Rate (bpm): 145 Fundal Height: 34 cm Movement: Present  Presentation: Vertex  General:  Alert, oriented and cooperative. Patient is in no acute distress.  Skin: Skin is warm and dry. No rash noted.   Cardiovascular: Normal heart rate noted  Respiratory: Normal respiratory effort, no problems with respiration noted  Abdomen: Soft, gravid, appropriate for gestational age.  Pain/Pressure: Absent     Pelvic: Cervical exam deferred        Extremities: Normal range of motion.  Edema: None  Mental Status: Normal mood and affect. Normal behavior. Normal judgment and thought content.   Assessment and Plan:  Pregnancy: G3P2002 at [redacted]w[redacted]d  1. Gestational diabetes mellitus (GDM) affecting  pregnancy Fasting- 82-95 ( only one over 95) 2hr 80-160 (5 of 16 elevated)  -Patient reports dietary indiscretion with all elevated values -- ie ate 3 tortillas or a ice cream.  -Encouraged her to continue with diet changes and focus on lean proteins and vegetables.  - Reviewed importance of exercise and sleep - Had growth [place] late July  - Urinalysis (Urine Dip)  2. Supervision of high risk pregnancy, antepartum Up to date  3. Previous cesarean section x2: 05/02/12 LTCS & 2016 Has CS scheduled on 8/30 with BTS  4. Multigravida of advanced maternal age in third trimester Declined NIP  5. Third-stage postpartum hemorrhage LD aware, consider TX with CS per surgeon  6. Obesity affecting pregnancy in third trimester TWG=34 lb 6.4 oz (15.6 kg) which is above goal. Weight is stable since last visit.    Preterm labor symptoms and general obstetric precautions including but not limited to vaginal bleeding, contractions, leaking of fluid and fetal movement were reviewed in detail with the patient. Please refer to After Visit Summary for other counseling recommendations.   Return in about 2 weeks (around 08/20/2020) for Routine prenatal care.  Future Appointments  Date Time Provider Department Center  08/25/2020  7:30 AM [facility]  09/15/2020  8:10 AM Jerene Pitch, Jaquelyn Bitter, MD [facility] None  09/22/2020  1:00 PM [facility] PAT1 [facility] None  09/28/2020  8:10 AM [facility] None    Federico Flake, MD

## 2020-08-19 ENCOUNTER — Other Ambulatory Visit: Payer: Self-pay | Admitting: Advanced Practice Midwife

## 2020-08-19 DIAGNOSIS — O24419 Gestational diabetes mellitus in pregnancy, unspecified control: Secondary | ICD-10-CM

## 2020-08-20 ENCOUNTER — Other Ambulatory Visit: Payer: Self-pay

## 2020-08-20 ENCOUNTER — Ambulatory Visit: Payer: Self-pay | Admitting: Physician Assistant

## 2020-08-20 VITALS — BP 107/65 | HR 68 | Temp 97.6°F | Wt 176.8 lb

## 2020-08-20 DIAGNOSIS — O99213 Obesity complicating pregnancy, third trimester: Secondary | ICD-10-CM

## 2020-08-20 DIAGNOSIS — O09523 Supervision of elderly multigravida, third trimester: Secondary | ICD-10-CM

## 2020-08-20 DIAGNOSIS — O24419 Gestational diabetes mellitus in pregnancy, unspecified control: Secondary | ICD-10-CM

## 2020-08-20 DIAGNOSIS — Z98891 History of uterine scar from previous surgery: Secondary | ICD-10-CM

## 2020-08-20 DIAGNOSIS — O0993 Supervision of high risk pregnancy, unspecified, third trimester: Secondary | ICD-10-CM

## 2020-08-20 DIAGNOSIS — O0990 Supervision of high risk pregnancy, unspecified, unspecified trimester: Secondary | ICD-10-CM

## 2020-08-20 LAB — URINALYSIS
Bilirubin, UA: NEGATIVE
Glucose, UA: NEGATIVE
Ketones, UA: NEGATIVE
Leukocytes,UA: NEGATIVE
Nitrite, UA: NEGATIVE
Protein,UA: NEGATIVE
Specific Gravity, UA: 1.025 (ref 1.005–1.030)
Urobilinogen, Ur: 0.2 mg/dL (ref 0.2–1.0)
pH, UA: 6 (ref 5.0–7.5)

## 2020-08-20 NOTE — Progress Notes (Signed)
Here today for 33.6 week Mh RV. Taking PNV and ASA QD. Denies ED/hospital visits since last RV. Has follow up MFM [place] 08/25/20 and is aware. Has planned C-Section with BTL on 09/29/2020. Tawny Hopping, RN

## 2020-08-20 NOTE — Progress Notes (Signed)
   PRENATAL VISIT NOTE  Subjective:  Tara Chavez is a 40 y.o. G3P2002 at [redacted]w[redacted]d being seen today for ongoing prenatal care.  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  Patient reports occasional contractions.  Contractions: Not present. Vag. Bleeding: None.  Movement: Present. Denies leaking of fluid/ROM.   The following portions of the patient's history were reviewed and updated as appropriate: allergies, current medications, past family history, past medical history, past social history, past surgical history and problem list. Problem list updated.  Objective:   Vitals:   08/20/20 0910  BP: 107/65  Pulse: 68  Temp: 97.6 F (36.4 C)  Weight: 176 lb 12.8 oz (80.2 kg)    Fetal Status: Fetal Heart Rate (bpm): 136 Fundal Height: 35 cm Movement: Present     General:  Alert, oriented and cooperative. Patient is in no acute distress.  Skin: Skin is warm and dry. No rash noted.   Cardiovascular: Normal heart rate noted  Respiratory: Normal respiratory effort, no problems with respiration noted  Abdomen: Soft, gravid, appropriate for gestational age.  Pain/Pressure: Absent     Pelvic: Cervical exam deferred        Extremities: Normal range of motion.  Edema: None  Mental Status: Normal mood and affect. Normal behavior. Normal judgment and thought content.   Assessment and Plan:  Pregnancy: G3P2002 at [redacted]w[redacted]d  1. Supervision of high risk pregnancy, antepartum Enc pt to drink lots of water  in light of very hot weather. - Urinalysis (Urine Dip)  2. Gestational diabetes mellitus (GDM) affecting pregnancy Reviewed blood sugar log, >50% FBS <95, but significant number of pp BS readings >120, esp post supper (highest 161). Reviewed diet/exercise hx, pt states not walking due to hot weather, and notes correlation with tortilla/fried food heavy days and higher BS readings. Homero Fellers discussion of starting metformin vs. Stricter compliance to diet/exercise (walk in air-conditioned mall) and pt elects to try diet/exercise. Will monitor closely and pt understands may need med if this approach is not successful.  Start weekly NST at 36 wk. - Urinalysis (Urine Dip)  3. Obesity affecting pregnancy in third trimester Excessive overall wt gain, reinforced diet. Keep growth [place] as sched 7/26.  4. Previous cesarean section x2: 05/02/12 LTCS & 2016 Repeat C/S with BTL sched 8/30.  5. Multigravida of advanced maternal age in third trimester    Preterm labor symptoms and general obstetric precautions including but not limited to vaginal bleeding, contractions, leaking of fluid and fetal movement were reviewed in detail with the patient. Please refer to After Visit Summary for other counseling recommendations.  No follow-ups on file.  Future Appointments  Date Time Provider Department Center  08/25/2020  7:30 AM [facility]  09/15/2020  8:10 AM Schuman, Jaquelyn Bitter, MD [facility] None  09/22/2020  1:00 PM [facility] PAT[facility] None  09/28/2020  8:10 AM [facility] None    Landry Dyke, PA-C

## 2020-08-25 ENCOUNTER — Other Ambulatory Visit: Payer: Self-pay

## 2020-08-25 ENCOUNTER — Ambulatory Visit: Payer: Self-pay | Attending: Maternal & Fetal Medicine

## 2020-08-25 DIAGNOSIS — O24419 Gestational diabetes mellitus in pregnancy, unspecified control: Secondary | ICD-10-CM | POA: Insufficient documentation

## 2020-08-25 DIAGNOSIS — E669 Obesity, unspecified: Secondary | ICD-10-CM | POA: Insufficient documentation

## 2020-08-25 DIAGNOSIS — O09523 Supervision of elderly multigravida, third trimester: Secondary | ICD-10-CM | POA: Insufficient documentation

## 2020-08-25 DIAGNOSIS — Z3A34 34 weeks gestation of pregnancy: Secondary | ICD-10-CM | POA: Insufficient documentation

## 2020-08-25 DIAGNOSIS — O99213 Obesity complicating pregnancy, third trimester: Secondary | ICD-10-CM | POA: Insufficient documentation

## 2020-08-25 IMAGING — US US MFM OB FOLLOW-UP
1 series · 14 of 25 positions shown · non-contrast
Comparison: none

[Series 1: us mfm ob follow-up · 25 acquisitions, 14 frames shown]
[im 1/25]
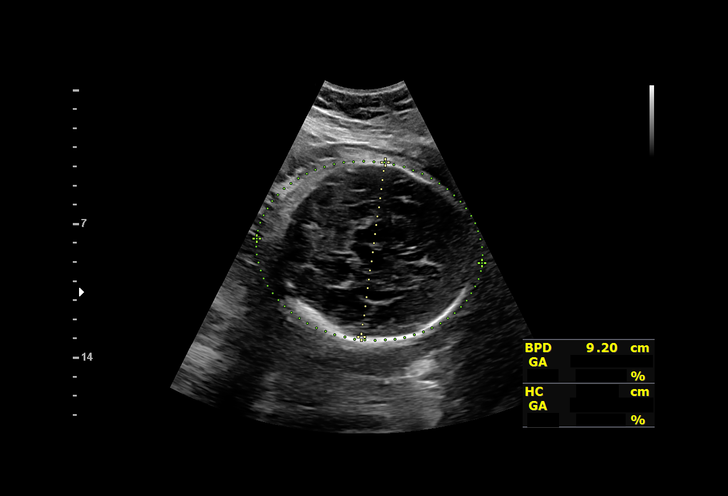
[im 3/25]
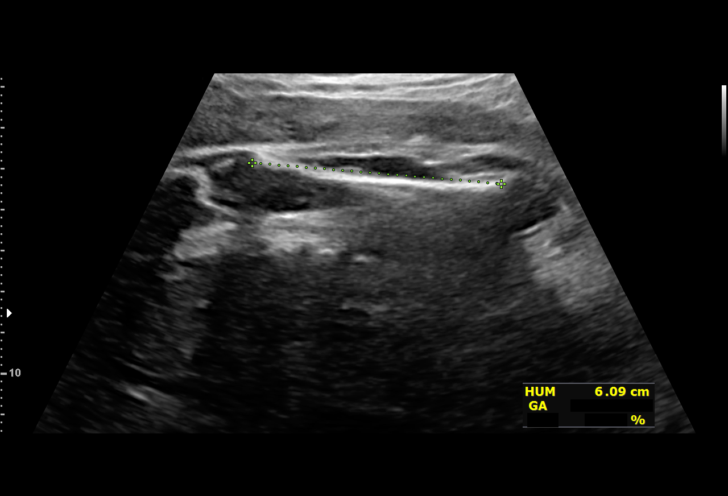
[im 5/25]
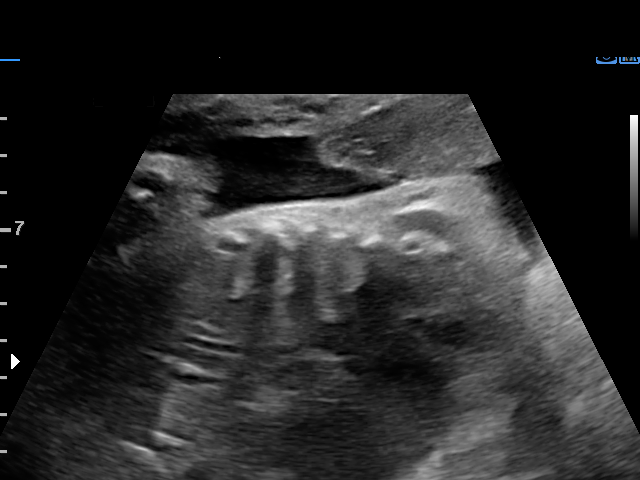
[im 7/25]
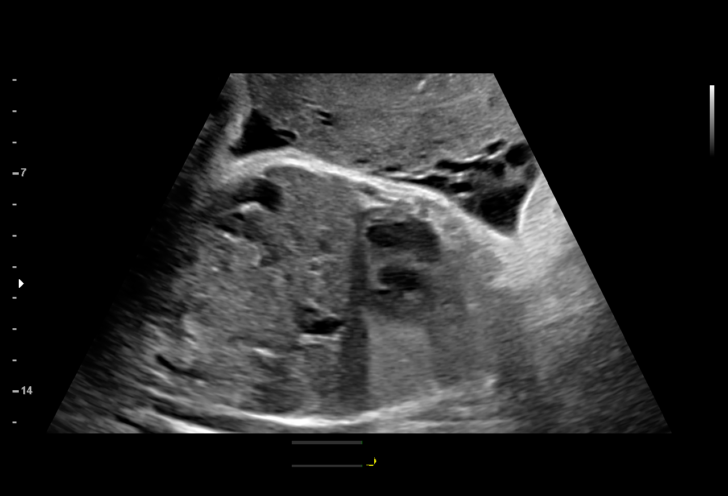
[im 9/25]
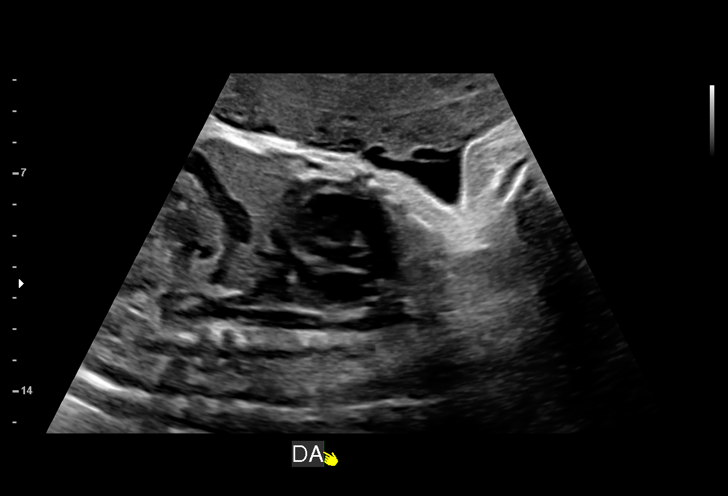
[im 10/25]
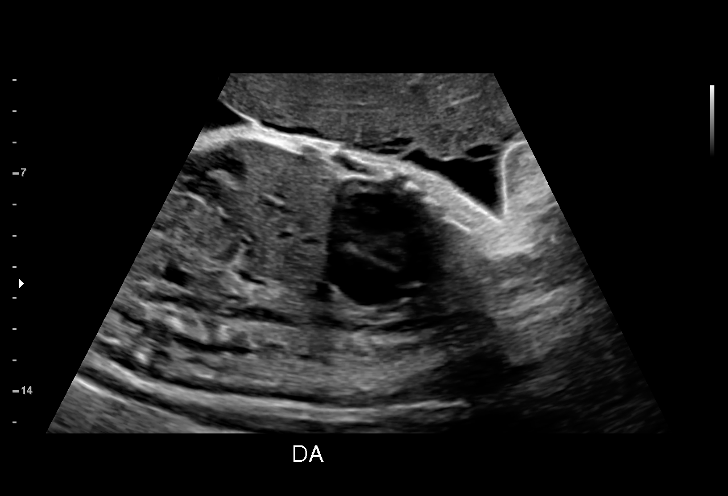
[im 12/25]
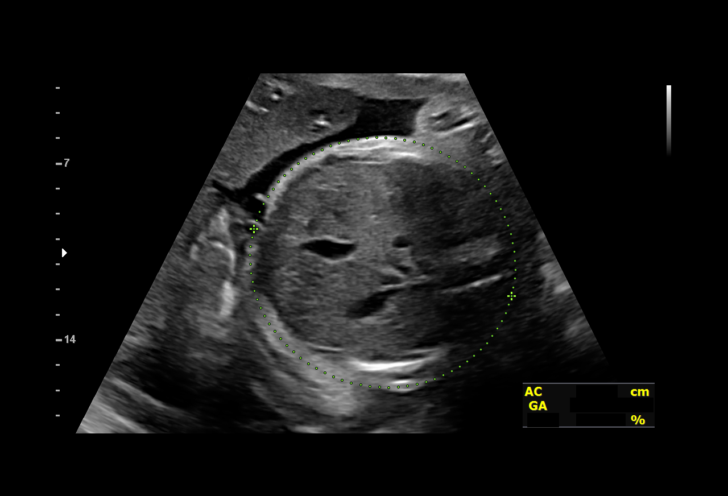
[im 14/25]
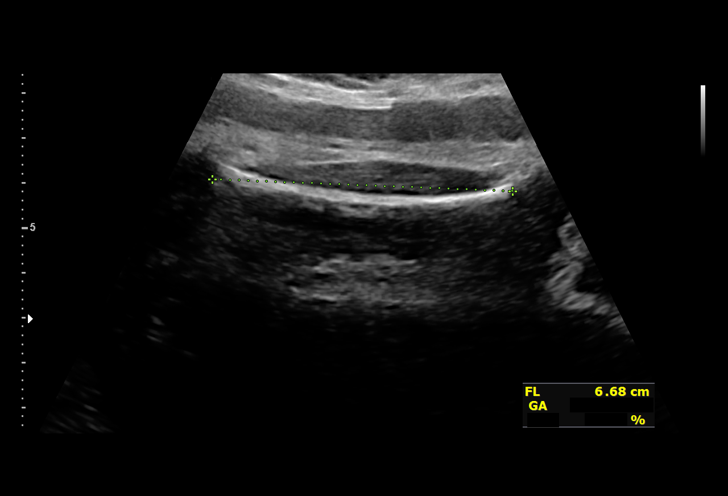
[im 16/25]
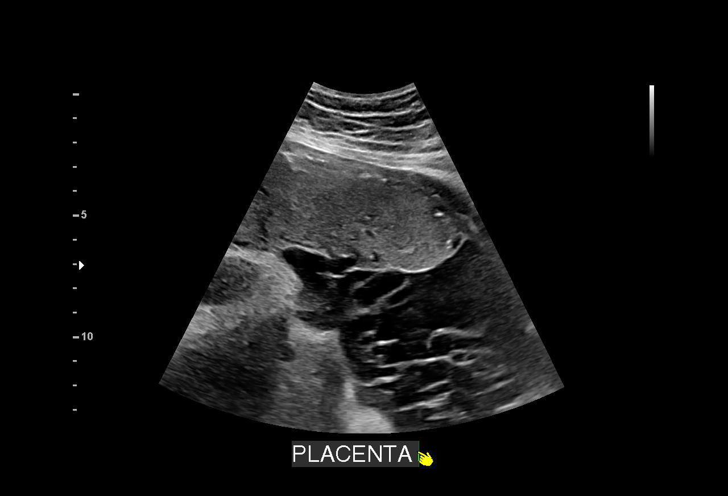
[im 17/25]
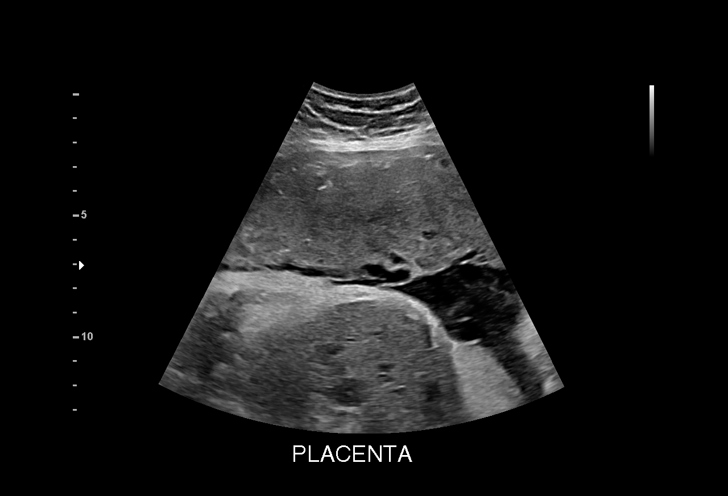
[im 19/25]
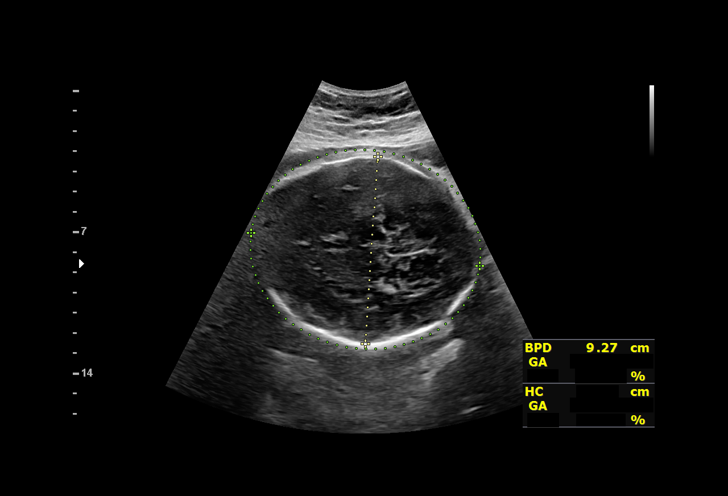
[im 21/25]
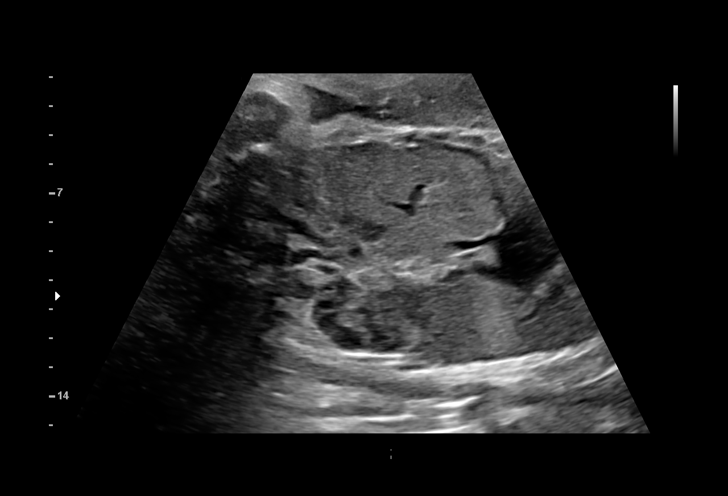
[im 23/25]
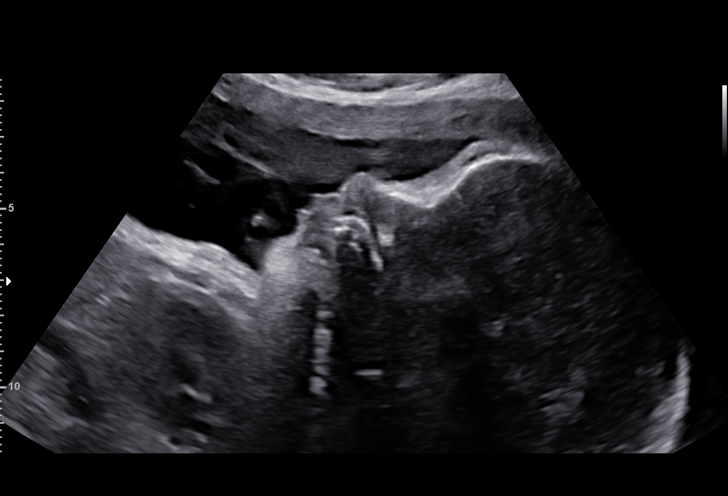
[im 25/25]
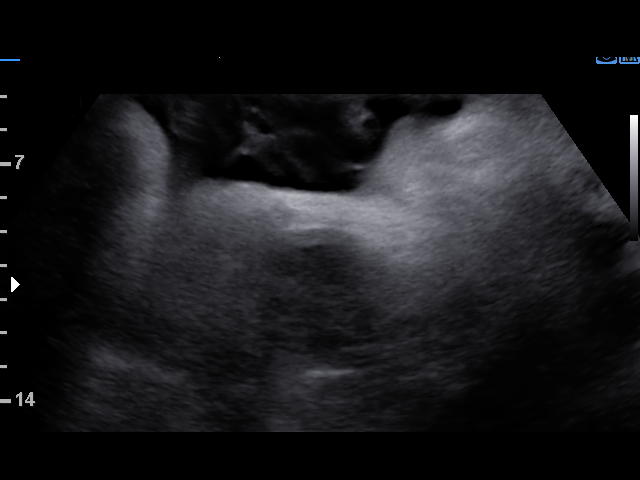

[14 of 25 positions shown; findings below may reference images not displayed]

[facility]IM[facility]

                   Sonographer                              [HOSPITAL] at
                   [facility] CNM

Indications

 34 weeks gestation of pregnancy
 Obesity complicating pregnancy                 [A2] [A2]
 Advanced maternal age multigravida 35+,        [A2]
 third trimester
 Gestational diabetes in pregnancy,             [A2]
 unspecified control
Fetal Evaluation

 Num Of Fetuses:         1
 Fetal Heart Rate(bpm):  137
 Cardiac Activity:       Observed
 Presentation:           Cephalic
 Placenta:               Anterior

 Amniotic Fluid
 AFI FV:      Within normal limits

 AFI Sum(cm)     %Tile       Largest Pocket(cm)
 14.1            50

 RUQ(cm)       RLQ(cm)       LUQ(cm)        LLQ(cm)

Biophysical Evaluation
 Amniotic F.V:   Within normal limits       F. Tone:        Observed
 F. Movement:    Observed                   Score:          [DATE]
 F. Breathing:   Observed
Biometry

 BPD:      92.8  mm     G. Age:  37w 5d       > 99  %    CI:           77   %    70 - 86
                                                         FL/HC:      20.1   %    20.1 -
 HC:      334.9  mm     G. Age:  38w 2d         94  %    HC/AC:      1.05        0.93 -
 AC:      317.5  mm     G. Age:  35w 5d         84  %    FL/BPD:     72.6   %    71 - 87
 FL:       67.4  mm     G. Age:  34w 5d         44  %    FL/AC:      21.2   %    20 - 24
 HUM:      59.9  mm     G. Age:  34w 6d         65  %

 Est. FW:    [A2]  gm      6 lb 3 oz     82  %
Gestational Age

 LMP:           37w 4d        Date:  [DATE]                 EDD:   [DATE]
 U/S Today:     36w 4d                                        EDD:   [DATE]
 Best:          34w 4d     Det. By:  U/S  ([DATE])          EDD:   [DATE]
Anatomy

 Cranium:               Appears normal         Aortic Arch:            Previously seen
 Cavum:                 Previously seen        Ductal Arch:            Appears normal
 Ventricles:            Previously seen        Diaphragm:              Appears normal
 Choroid Plexus:        Previously seen        Stomach:                Appears normal, left
                                                                       sided
 Cerebellum:            Previously seen        Abdomen:                Appears normal
 Posterior Fossa:       Previously seen        Abdominal Wall:         Previously seen
 Nuchal Fold:           Previously seen        Cord Vessels:           Previously seen
 Face:                  Appears normal         Kidneys:                Appear normal
                        (orbits and profile)
 Lips:                  Previously seen        Bladder:                Appears normal
 Heart:                 Previously seen        Spine:                  Previously seen
 RVOT:                  Previously seen        Upper Extremities:      Previously seen
 LVOT:                  Previously seen        Lower Extremities:      Previously seen
Impression

 Follow up growth due to [A2]
 Normal interval growth with measurements consistent with
 dates
 Good fetal movement and amniotic fluid volume

Recommendations

 Follow up as clinically indicated.

## 2020-08-26 ENCOUNTER — Encounter: Payer: Self-pay | Admitting: Advanced Practice Midwife

## 2020-09-03 ENCOUNTER — Other Ambulatory Visit: Payer: Self-pay

## 2020-09-03 ENCOUNTER — Encounter: Payer: Self-pay | Admitting: Physician Assistant

## 2020-09-03 ENCOUNTER — Ambulatory Visit: Payer: Self-pay | Admitting: Physician Assistant

## 2020-09-03 VITALS — BP 120/79 | HR 73 | Temp 97.3°F | Wt 181.2 lb

## 2020-09-03 DIAGNOSIS — O0990 Supervision of high risk pregnancy, unspecified, unspecified trimester: Secondary | ICD-10-CM

## 2020-09-03 DIAGNOSIS — O09523 Supervision of elderly multigravida, third trimester: Secondary | ICD-10-CM

## 2020-09-03 DIAGNOSIS — O99213 Obesity complicating pregnancy, third trimester: Secondary | ICD-10-CM

## 2020-09-03 DIAGNOSIS — O24419 Gestational diabetes mellitus in pregnancy, unspecified control: Secondary | ICD-10-CM

## 2020-09-03 DIAGNOSIS — Z98891 History of uterine scar from previous surgery: Secondary | ICD-10-CM

## 2020-09-03 DIAGNOSIS — O0993 Supervision of high risk pregnancy, unspecified, third trimester: Secondary | ICD-10-CM

## 2020-09-03 LAB — URINALYSIS
Bilirubin, UA: NEGATIVE
Glucose, UA: NEGATIVE
Ketones, UA: NEGATIVE
Leukocytes,UA: NEGATIVE
Nitrite, UA: NEGATIVE
Protein,UA: NEGATIVE
RBC, UA: NEGATIVE
Specific Gravity, UA: 1.025 (ref 1.005–1.030)
Urobilinogen, Ur: 0.2 mg/dL (ref 0.2–1.0)
pH, UA: 6 (ref 5.0–7.5)

## 2020-09-03 MED ORDER — PRENATAL MULTIVITAMIN CH: 1.0000 | ORAL_TABLET | Freq: Every day | ORAL | 0 refills | Status: DC

## 2020-09-03 MED ORDER — METFORMIN HCL 500 MG PO TABS: 500.0000 mg | ORAL_TABLET | Freq: Two times a day (BID) | ORAL | 0 refills | Status: AC

## 2020-09-03 NOTE — Progress Notes (Signed)
Patient here at 35w 6d for MH RV.   Copy of blood sugar log made and given to provider, A. Streilein, PA-C. Urine dip reviewed during clinic visit.   Refill of PNV requested and given to patient. Patient advised to discontinue ASA 81mg  starting tomorrow at [redacted]wks gestation.   Patient made aware of multiple upcoming appointments that include the following:   Sch 09/15/2020  8:10 AM PRE-OP 20 min [facility] Schuman, 09/17/2020, MD  Appointment Notes:   H&P           Sch 09/22/2020  1:00 PM PRE-ADMISSION TESTING 45 45 min [facility]  Arrive at: PHONE CALL- Provider will call you [facility] PAT[facility]  Appointment Notes:   PHONE, faxed back 7/6           Sch 09/28/2020  8:10 AM LAB 5 min [facility]  Arrive at: IN PERSON [facility]  Appointment Notes:   COVID A C Section    Copy made and given to patient along with the address for [facility]. Patient verbalizes understanding.  09/30/2020

## 2020-09-03 NOTE — Progress Notes (Signed)
PRENATAL VISIT NOTE  Subjective:  Tara Chavez is a 40 y.o. G3P2002 at [redacted]w[redacted]d being seen today for ongoing prenatal care.  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  Patient reports no complaints.  Contractions: Irritability. Vag. Bleeding: None.  Movement: Present. Denies leaking of fluid/ROM.   The following portions of the patient's history were reviewed and updated as appropriate: allergies, current medications, past family history, past medical history, past social history, past surgical history and problem list. Problem list updated.  Objective:   Vitals:   09/03/20 0846 09/03/20 0907  BP: 129/80 120/79  Pulse: 71 73  Temp: (!) 97.3 F (36.3 C)   Weight: 181 lb 3.2 oz (82.2 kg)     Fetal Status: Fetal Heart Rate (bpm): 132 Fundal Height: 38 cm Movement: Present  Presentation: Vertex  General:  Alert, oriented and cooperative. Patient is in no acute distress.  Skin: Skin is warm and dry. No rash noted.   Cardiovascular: Normal heart rate noted  Respiratory: Normal respiratory effort, no problems with respiration noted  Abdomen: Soft, gravid, appropriate for gestational age.  Pain/Pressure: Absent     Pelvic: Cervical exam deferred        Extremities: Normal range of motion.  Edema: None  Mental Status: Normal mood and affect. Normal behavior. Normal judgment and thought content.   Assessment and Plan:  Pregnancy: G3P2002 at [redacted]w[redacted]d  1. Supervision of high risk pregnancy,  antepartum Enc to drink plenty of water in light of very hot weather. Anticipatory guidance re: routine rectovag testing at next visit. - Urinalysis (Urine Dip) - Prenatal Vit-Fe Fumarate-FA (PRENATAL MULTIVITAMIN) TABS tablet; Take 1 tablet by mouth daily at 12 noon.  Dispense: 100 tablet; Refill: 0  2. Gestational diabetes mellitus (GDM) affecting pregnancy 07/14/20 Reviewed 08/25/20 fetal growth [place] (EFW 82%, AFI WNL). Reviewed interim home BS log: most FBS < 95, but >50% 2h pp readings >120, though none very high. Pt states she has been more compliant with diet, but not exercise. I counseled that medication was now indicated for increased BS control, and pt in agreement. Start metformin 500mg  bid, continue diabetic diet, enc walking (indoors if possible). Start twice weekly NST next week (will be 36 wk.) - metFORMIN (GLUCOPHAGE) 500 MG tablet; Take 1 tablet (500 mg total) by mouth 2 (two) times daily with a meal.  Dispense: 52 tablet; Refill: 0  3. Obesity affecting pregnancy in third trimester 41 lb TWG, excessive. Focus on diabetic diet/enc walking. Discontinue low dose aspirin tomorrow at 36 wk.  4. Multigravida of advanced maternal age in third trimester Discontinue aspirin tomorrow at 36 wk.  5. Previous cesarean section x2: 05/02/12 LTCS & 2016 Repeat C/s sched 8/30.   Preterm labor symptoms and general obstetric precautions including but not limited to vaginal bleeding, contractions, leaking of fluid and fetal movement were reviewed in detail with the patient. Due to language barrier, an interpreter was present during the provider portion of the visit with this patient.  Please refer to After Visit Summary for other counseling recommendations.  Return in about 1 week (  around 09/10/2020) for Routine prenatal care, 36 wk labs.  Future Appointments  Date Time Provider Department Center  09/07/2020  9:00 AM [facility] PROVIDER [facility] None  09/10/2020 10:00 AM [facility] PROVIDER [facility] None   09/10/2020 10:20 AM [facility] PROVIDER [facility] None  09/15/2020  8:10 AM Jerene Pitch, Jaquelyn Bitter, MD [facility] None  09/22/2020  1:00 PM [facility] PAT[facility] None  09/28/2020  8:10 AM [facility] None    Landry Dyke, PA-C

## 2020-09-07 ENCOUNTER — Ambulatory Visit: Payer: Self-pay | Admitting: Family Medicine

## 2020-09-07 ENCOUNTER — Other Ambulatory Visit: Payer: Self-pay

## 2020-09-07 VITALS — BP 121/74 | HR 77 | Temp 97.3°F | Wt 180.2 lb

## 2020-09-07 DIAGNOSIS — O0993 Supervision of high risk pregnancy, unspecified, third trimester: Secondary | ICD-10-CM

## 2020-09-07 DIAGNOSIS — O0990 Supervision of high risk pregnancy, unspecified, unspecified trimester: Secondary | ICD-10-CM

## 2020-09-07 NOTE — Progress Notes (Signed)
Pt is twice weekly NST and presents to clinic today for NST.    I reviewed the patient's fetal monitoring: Baseline HR: 140 Variability:  moderate Accels:present Decels: none Contractions: no contractions, although there are spikes on uterine activity channel that may indicate fetal or maternal mov't. A/P: Reactive NST. Reassured regarding fetal status.   Wendi Snipes, FNP

## 2020-09-10 ENCOUNTER — Other Ambulatory Visit: Payer: Self-pay

## 2020-09-10 ENCOUNTER — Ambulatory Visit: Payer: Self-pay | Admitting: Family Medicine

## 2020-09-10 VITALS — BP 118/76 | HR 72 | Temp 98.0°F | Wt 182.6 lb

## 2020-09-10 DIAGNOSIS — O24419 Gestational diabetes mellitus in pregnancy, unspecified control: Secondary | ICD-10-CM

## 2020-09-10 DIAGNOSIS — O09523 Supervision of elderly multigravida, third trimester: Secondary | ICD-10-CM

## 2020-09-10 DIAGNOSIS — O24435 Gestational diabetes mellitus in puerperium, controlled by oral hypoglycemic drugs: Secondary | ICD-10-CM

## 2020-09-10 DIAGNOSIS — O0990 Supervision of high risk pregnancy, unspecified, unspecified trimester: Secondary | ICD-10-CM

## 2020-09-10 DIAGNOSIS — O0993 Supervision of high risk pregnancy, unspecified, third trimester: Secondary | ICD-10-CM

## 2020-09-10 DIAGNOSIS — O99213 Obesity complicating pregnancy, third trimester: Secondary | ICD-10-CM

## 2020-09-10 LAB — URINALYSIS
Bilirubin, UA: NEGATIVE
Glucose, UA: NEGATIVE
Ketones, UA: NEGATIVE
Leukocytes,UA: NEGATIVE
Nitrite, UA: NEGATIVE
Protein,UA: NEGATIVE
RBC, UA: NEGATIVE
Specific Gravity, UA: 1.02 (ref 1.005–1.030)
Urobilinogen, Ur: 0.2 mg/dL (ref 0.2–1.0)
pH, UA: 6.5 (ref 5.0–7.5)

## 2020-09-10 NOTE — Progress Notes (Signed)
   PRENATAL VISIT NOTE  Subjective:  Tara Chavez is a 40 y.o. G3P2002 at [redacted]w[redacted]d being seen today for ongoing prenatal care.  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  Patient reports no complaints.  Contractions: Irregular. Vag. Bleeding: None.  Movement: Present. Denies leaking of fluid/ROM.   The following portions of the patient's history were reviewed and updated as appropriate: allergies, current medications, past family history, past medical history, past social history, past surgical history and problem list. Problem list updated.  Objective:   Vitals:   09/10/20 1034  BP: 118/76  Pulse: 72  Temp: 98 F (36.7 C)  Weight: 182 lb 9.6 oz (82.8 kg)    Fetal Status: Fetal Heart Rate (bpm): 140 Fundal Height: 38 cm Movement: Present  Presentation: Vertex  General:  Alert, oriented and cooperative. Patient is in no acute distress.  Skin: Skin is warm and dry. No rash noted.   Cardiovascular: Normal heart rate noted  Respiratory: Normal respiratory effort, no problems with respiration noted  Abdomen: Soft, gravid, appropriate for gestational age.  Pain/Pressure: Absent     Pelvic: Cervical exam deferred        Extremities: Normal range of motion.  Edema: None  Mental Status: Normal mood and affect. Normal behavior. Normal judgment and thought content.   Assessment and Plan:  Pregnancy: G3P2002 at [redacted]w[redacted]d  1. Supervision of high risk pregnancy,  antepartum Up to date  - Chlamydia/GC NAA, Confirmation - Culture, beta strep (group b only) - Urinalysis  2. Gestational diabetes mellitus (GDM) affecting pregnancy Reviewed BG log Patient currently on Metformin 1000mg  BID, reports compliance Fastings 79-92 2h pp 95-130, typically low 120s  While not in fully desired range they are overall well controlled. Given fastings are well controlled I think insulin is of little benefit for this patient currently.   Continue NST x2 weekly- NST today is reactive and reassuring.  35 wk showed EFW 2796, 82nd% with AC 84th% RCS is scheduled on 8/30  - Urinalysis  3. Multigravida of advanced maternal age in third trimester  4. Obesity affecting pregnancy in third trimester TWG= 42 lb 9.6 oz (19.3 kg) which is very much above goal for this pregnancy    5. Gestational diabetes mellitus (GDM) during puerperium controlled on oral hypoglycemic therapy Continue metformin CS at 39 weks Continue NST 2 x weekly- weekly BPP is ideal but patient unable to afford MFM visit.    Preterm labor symptoms and general obstetric precautions including but not limited to vaginal bleeding, contractions, leaking of fluid and fetal movement were reviewed in detail with the patient. Please refer to After Visit Summary for other counseling recommendations.  Return in about 4 days (around 09/14/2020) for NST.  Future Appointments  Date Time Provider Department Center  09/15/2020  8:10 AM 09/17/2020, MD [facility] None  09/17/2020  9:20 AM [facility] PROVIDER [facility] None  09/22/2020  1:00 PM [facility] PAT[facility] None  09/28/2020  8:10 AM [facility] None    09/30/2020, MD

## 2020-09-10 NOTE — Progress Notes (Signed)
Patient here for MH RV at 36 6/7. Prefers provider to collect labs. 36 week packet given and reviewed. Patient sent to lab for urine dip, BS log copied. Needs NST today.Burt Knack, RN

## 2020-09-10 NOTE — Progress Notes (Signed)
NST done today and reviewed by provider. Patient scheduled to return on 8/15//22 for NST and on 09/17/2020 for MH RV and NST.Marland KitchenBurt Knack, RN

## 2020-09-13 LAB — CULTURE, BETA STREP (GROUP B ONLY): Strep Gp B Culture: POSITIVE — AB

## 2020-09-14 ENCOUNTER — Other Ambulatory Visit: Payer: Self-pay

## 2020-09-14 ENCOUNTER — Ambulatory Visit: Payer: Self-pay | Admitting: Advanced Practice Midwife

## 2020-09-14 DIAGNOSIS — O0990 Supervision of high risk pregnancy, unspecified, unspecified trimester: Secondary | ICD-10-CM

## 2020-09-14 DIAGNOSIS — O0993 Supervision of high risk pregnancy, unspecified, third trimester: Secondary | ICD-10-CM

## 2020-09-14 NOTE — Progress Notes (Signed)
NST reviewed by provider, and patient NST given to provider to review. Patient aware of MH RV with NST on 09/17/2020.Marland KitchenBurt Knack, RN

## 2020-09-14 NOTE — Progress Notes (Signed)
Presents today for NST only. Blood sugar log copied and in outguide for provider review. Counseled regarding GBS positive.Aware of [facility] pre-op appt tomorrow at 0810 and [facility] appt with arrival time of 0900. Also aware of pre-admit testing 09/22/20 with Covid test 09/28/2020 prior to 09/29/2020 C-section. Jossie Ng, RN NST initiated at [phone]. Jossie Ng, RN

## 2020-09-14 NOTE — Progress Notes (Signed)
40 yo Pt here for NST only 2x/wk for gestational diabetes on Metformin 500 mg BID at 37 3/7 wks.  NST reactive.  Has apt here 09/17/20 and pre op with [facility] MD on 09/15/20. Repeat c/s 09/29/20. RN gave me pt's blood sugar log after pt left building: FBS: 76-92. 2 hour pp: 100-130 (7 out of 12 values elevated). Has apt here 09/17/20

## 2020-09-15 ENCOUNTER — Ambulatory Visit (INDEPENDENT_AMBULATORY_CARE_PROVIDER_SITE_OTHER): Payer: Self-pay | Admitting: Obstetrics and Gynecology

## 2020-09-15 VITALS — BP 124/70 | Ht 60.0 in | Wt 183.2 lb

## 2020-09-15 DIAGNOSIS — Z3A38 38 weeks gestation of pregnancy: Secondary | ICD-10-CM

## 2020-09-15 DIAGNOSIS — O0990 Supervision of high risk pregnancy, unspecified, unspecified trimester: Secondary | ICD-10-CM

## 2020-09-15 DIAGNOSIS — O09523 Supervision of elderly multigravida, third trimester: Secondary | ICD-10-CM

## 2020-09-15 DIAGNOSIS — O24419 Gestational diabetes mellitus in pregnancy, unspecified control: Secondary | ICD-10-CM

## 2020-09-15 DIAGNOSIS — B951 Streptococcus, group B, as the cause of diseases classified elsewhere: Secondary | ICD-10-CM | POA: Insufficient documentation

## 2020-09-15 DIAGNOSIS — O34219 Maternal care for unspecified type scar from previous cesarean delivery: Secondary | ICD-10-CM

## 2020-09-15 DIAGNOSIS — Z98891 History of uterine scar from previous surgery: Secondary | ICD-10-CM

## 2020-09-15 LAB — CHLAMYDIA/GC NAA, CONFIRMATION
Chlamydia trachomatis, NAA: NEGATIVE
Neisseria gonorrhoeae, NAA: NEGATIVE

## 2020-09-15 NOTE — Progress Notes (Signed)
Patient ID: Tara Chavez, female   DOB: 1980-08-26, 40 y.o.   MRN: [id number]  Reason for Consult: No chief complaint on file.   Referred by Department, [place]  Subjective:     HPI:  Tara Chavez is a 40 y.o. female. She is here today for a preoperative visit before her cesarean section. She has not obstetrical complaints.    Pregnancy 2022 Problems (from 04/07/20 to present)     Problem Noted Resolved   Gestational diabetes mellitus (GDM) during puerperium controlled on oral hypoglycemic therapy 07/17/2020 by Herbie Saxon, CNM No   Overview Addendum 09/03/2020 10:44 AM by Lora Havens, PA-C    Current Diabetic Medications:  None as of 6/17; start metformin on 09/03/20  [x ]ordered meter, strips, lancets- Date: 07/17/20  Diabetic Teaching for A1GDM or A2GDM: [x]  Lifestyles at [facility] - sent 6/17   Baseline and surveillance labs (pulled in from [facility], refresh links as needed)  Lab Results  Component Value Date   CREATININE 0.51 (L) 05/01/2020   AST 17 05/01/2020   ALT 14 05/01/2020   TSH 1.990 05/01/2020   Lab Results  Component Value Date   HGBA1C 5.3 04/08/2020   HGBA1C 5.6% 03/28/2018    Antenatal Testing Class of DM U/S NST/AFI DELIVERY  Diabetes   A1 - good control - O24.410    A2 - good control - O24.419      A2  - poor control or poor compliance - O24.419, E11.65   (Macrosomia or polyhydramnios) **E11.65 is extra code for poor control**      20-36  20-36  20-24-28-32-36     40  32//2 x wk  32//2 x wk     40  39  PRN      [x ] referred for 34 week [place] on - 08/25/20 [x ] Results reviewed and EFW= 82% at 36 4/7 08/25/20 u/s with EFW=82% Start twice weekly NST at 36 wk on 09/07/20  Postpartum:  [ ]  2 hr GTT needed [ ]  Counsel patient on increased lifetime risk of diabetes and recommend regular DM screeing       Supervision of high risk pregnancy, antepartum 04/08/2020 by Herbie Saxon, CNM No    Overview Addendum 09/14/2020 10:01 AM by Rich Number, RN     Nursing Staff Provider  Office Location  [facility] Dating  U/S- 05/12/20 @19  w4d  Language  Spanish Anatomy US  06/09/20-33w1dcervical length 4.7 cm, normal growth with measurements consistent with dates.  F/U growth scheduled for 6 weeks.  initiate weekly testing @ 36 weeks   Flu Vaccine  Declined -  04/08/2020, 05/01/20 Genetic Screen  NIPS:   AFP:   First Screen:  Quad:  Abnormal Quad,  *quad drawn too soon, declines additional testing   TDaP vaccine    Hgb A1C or  GTT Early 1 hr = 162; 3hr gtt = WNL Third trimester: 3hr gtt = Elevated (07/14/20)   COVID vaccine Pfizer  (10/21/19,  11/11/2019)    Rhogam  NA   LAB RESULTS   Feeding Plan  breast and formula Blood Type   O positive  04/08/20  Contraception BTL Antibody   Negative 04/08/20  Circumcision  Rubella  Immune by titer,     10/13/2011    [facility]   Non reactive 04/08/20  Support Person  HBsAg    Negative  04/08/20   Prenatal Classes  HIV   Non-reactive           (  04/08/2020)  @28wk - Doula referral?  Varicella Immune by titer,     10/13/2011    HCV    Negative   04/08/20  BTL Consent  NA-- not covered by Medicaid-- DESIRES BTS GBS   Positive                (09/10/2020)        VBAC Consent  Pap  03/06/17 neg HPV neg; 04/08/20=neg    Hgb Electro  Normal Hgb,        10/13/2011  BP Cuff ordered  CF   Delivery Group  [facility]   Centering Group  OBCM involved         Abnormal glucose affecting pregnancy 04/08/20 1 hour=162 04/09/2020 by Rich Number, RN 07/17/2020 by Caren Macadam, MD   Overview Addendum 04/17/2020  5:13 PM by Junious Dresser, FNP    1 hr = 162, needs 3 hour GTT=04/14/20= WNL           OB History  Gravida Para Term Preterm AB Living  3 2 2  0 0 2  SAB IAB Ectopic Multiple Live Births  0 0 0 0 2    # Outcome Date GA Lbr Len/2nd Weight Sex Delivery Anes PTL Lv  3 Current           2 Term 07/21/14 [redacted]w[redacted]d 8 lb 8.3 oz (3.864 kg) F  CS-LTranv EPI  LIV     Birth Comments: Per [facility] notes in Epic: protracted labor course and chorioamnionitis  1 Term 05/21/12 424w1d7 lb 10 oz (3.459 kg) M CS-LTranv  N LIV     Birth Comments: Active phase, arrest 8cm, nonreassuring fetal monitoring in paperchart.  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"   Family History  Problem Relation Age of Onset   Heart disease Mother    Kidney disease Mother    Hypertension Mother    Hypertension Father    Angina Father    Migraines Sister    Multiple births Paternal Aunt        twins   Cancer Maternal Grandmother        unsure of kind-had surgery   Multiple births Paternal Aunt        triplets   Past Surgical History:  Procedure Laterality Date   CESAREAN SECTION      Short Social History:  Social History   Tobacco Use   Smoking status: Former   Smokeless tobacco: Never   Tobacco comments:    quit about 2011, denies secondhand smoke  Substance Use Topics   Alcohol use: Not Currently    Comment: "nothing in 10 years"    No Known Allergies  Current Outpatient Medications  Medication Sig Dispense Refill   acetaminophen (TYLENOL) 325 MG tablet Take 650 mg by mouth every 6 (six) hours as needed. Unsure of dose - to notify clinic at next appt of medicine strength. (Patient not taking: Reported on 09/14/2020)     aspirin EC 81 MG tablet Take 81 mg by mouth daily. Swallow whole. (Patient not  taking: Reported on 09/14/2020)     blood glucose meter kit and supplies KIT Dispense based on patient and insurance preference. Use up to four times daily as directed. 1 each 0   Glucose Blood (BLOOD GLUCOSE TEST  STRIPS) STRP 1 Units by In Vitro route in the morning, at noon, in the evening, and at bedtime. 120 strip 12   metFORMIN (GLUCOPHAGE) 500 MG tablet Take 1 tablet (500 mg total) by mouth 2 (two) times daily with a meal. 52 tablet 0   Prenatal Vit-Fe Fumarate-FA (MULTIVITAMIN-PRENATAL) 27-0.8 MG TABS tablet Take 1 tablet by mouth daily at 12 noon. (Patient not taking: No sig reported)     Prenatal Vit-Fe Fumarate-FA (PRENATAL MULTIVITAMIN) TABS tablet Take 1 tablet by mouth daily at 12 noon. 100 tablet 0   No current facility-administered medications for this visit.    Review of Systems  Constitutional: Negative for chills, fatigue, fever and unexpected weight change.  HENT: Negative for trouble swallowing.  Eyes: Negative for loss of vision.  Respiratory: Negative for cough, shortness of breath and wheezing.  Cardiovascular: Negative for chest pain, leg swelling, palpitations and syncope.  GI: Negative for abdominal pain, blood in stool, diarrhea, nausea and vomiting.  GU: Negative for difficulty urinating, dysuria, frequency and hematuria.  Musculoskeletal: Negative for back pain, leg pain and joint pain.  Skin: Negative for rash.  Neurological: Negative for dizziness, headaches, light-headedness, numbness and seizures.  Psychiatric: Negative for behavioral problem, confusion, depressed mood and sleep disturbance.       Objective:  Objective   There were no vitals filed for this visit. There is no height or weight on file to calculate BMI.  Physical Exam Vitals and nursing note reviewed. Exam conducted with a chaperone present.  Constitutional:      Appearance: Normal appearance.  HENT:     Head: Normocephalic and atraumatic.  Eyes:     Extraocular Movements: Extraocular movements intact.     Pupils: Pupils are equal, round, and reactive to light.  Cardiovascular:     Rate and Rhythm: Normal rate and regular rhythm.  Pulmonary:     Effort: Pulmonary effort is normal.      Breath sounds: Normal breath sounds.  Abdominal:     General: Abdomen is flat.     Palpations: Abdomen is soft.  Musculoskeletal:     Cervical back: Normal range of motion.  Skin:    General: Skin is warm and dry.  Neurological:     General: No focal deficit present.     Mental Status: She is alert and oriented to person, place, and time.  Psychiatric:        Behavior: Behavior normal.        Thought Content: Thought content normal.        Judgment: Judgment normal.    Assessment/Plan:     40 y.o. G3P2002 at 59w2dwith history of prior cesarean section who desires an elective repeat cesarean section with bilateral tubal ligation for sterilization.   Discussed risks, benefits, and alternatives with the patient for the cesarean section and bilateral tubal ligation.  Discussed the risk of infection bleeding and damage to surrounding pelvic tissues including but not limited to the uterus, fallopian tubes, ovaries, bowel, and bladder.  Discussed proper care for the incision postoperatively to avoid infection and signs and symptoms of infection to watch for.  Discussed possibility of an emergent blood transfusion for heavy blood loss.  Discussed risks associated with blood transfusion including transfusion reaction  and chronic infections, or sepsis which could lead to death. Discussed that a tubal ligation is considered a permanent sterilization procedure. Reversal surgery can be a possibility but can be dangerous and associated with risks such as an ectopic pregnancy. Tubal ligation has a failure rate of 1/1,000 and if she is ever suspicious of pregnancy she should take a pregnancy test.  Patient gave consent for the procedure and blood transfusion if needed. Consent forms were completed.  More than 20 minutes were spent face to face with the patient in the room, reviewing the medical record, labs and images, and coordinating care for the patient. The plan of management was discussed in detail and  counseling was provided.      Adrian Prows MD [facility] 09/15/2020 8:16 AM

## 2020-09-17 ENCOUNTER — Ambulatory Visit: Payer: Self-pay | Admitting: Physician Assistant

## 2020-09-17 ENCOUNTER — Other Ambulatory Visit: Payer: Self-pay

## 2020-09-17 VITALS — BP 126/68 | HR 74 | Temp 97.8°F | Wt 183.8 lb

## 2020-09-17 DIAGNOSIS — O24419 Gestational diabetes mellitus in pregnancy, unspecified control: Secondary | ICD-10-CM

## 2020-09-17 DIAGNOSIS — Z98891 History of uterine scar from previous surgery: Secondary | ICD-10-CM

## 2020-09-17 DIAGNOSIS — O0993 Supervision of high risk pregnancy, unspecified, third trimester: Secondary | ICD-10-CM

## 2020-09-17 DIAGNOSIS — O24435 Gestational diabetes mellitus in puerperium, controlled by oral hypoglycemic drugs: Secondary | ICD-10-CM

## 2020-09-17 DIAGNOSIS — O0990 Supervision of high risk pregnancy, unspecified, unspecified trimester: Secondary | ICD-10-CM

## 2020-09-17 LAB — URINALYSIS
Bilirubin, UA: NEGATIVE
Glucose, UA: NEGATIVE
Ketones, UA: NEGATIVE
Leukocytes,UA: NEGATIVE
Nitrite, UA: NEGATIVE
Protein,UA: NEGATIVE
RBC, UA: NEGATIVE
Specific Gravity, UA: 1.025 (ref 1.005–1.030)
Urobilinogen, Ur: 0.2 mg/dL (ref 0.2–1.0)
pH, UA: 6 (ref 5.0–7.5)

## 2020-09-17 NOTE — Progress Notes (Signed)
NST scheduled for 09/21/2020 and RV with NST scheduled for 09/24/2020. Appt reminder cards given for each appt. Jossie Ng, RN

## 2020-09-17 NOTE — Progress Notes (Signed)
Patient here at 37w 6d for MH RV and NST.  Blood sugar log copy made and given to provider. Patient aware of scheduled c/s on 09/29/20.   Patient placed on NST monitors at 0945.   Urine dip reviewed during clinic visit.  Floy Sabina, RN

## 2020-09-17 NOTE — Progress Notes (Signed)
   PRENATAL VISIT NOTE  Subjective:  Tara Chavez is a 40 y.o. G3P2002 at [redacted]w[redacted]d being seen today for ongoing prenatal care.  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  Patient reports no complaints.  Contractions: Irregular.  .  Movement: Present. Denies leaking of fluid/ROM.   The following portions of the patient's history were reviewed and updated as appropriate: allergies, current medications, past family history, past medical history, past social history, past surgical history and problem list. Problem list updated.  Objective:   Vitals:   09/17/20 0936  BP: 126/68  Pulse: 74  Temp: 97.8 F (36.6 C)  Weight: 183 lb 12.8 oz (83.4 kg)    Fetal Status: Fetal Heart Rate (bpm): 144 Fundal Height: 39 cm Movement: Present  Presentation: Vertex  General:  Alert, oriented and cooperative. Patient is in no acute distress.  Skin: Skin is warm and dry. No rash noted.   Cardiovascular: Normal heart rate noted  Respiratory: Normal respiratory effort, no problems with respiration noted  Abdomen: Soft, gravid, appropriate for gestational age.  Pain/Pressure: Absent     Pelvic: Cervical exam deferred        Extremities: Normal range of motion.  Edema: None  Mental Status: Normal mood and affect. Normal behavior. Normal judgment and thought content.   Assessment and Plan:  Pregnancy: G3P2002 at [redacted]w[redacted]d  1. Supervision of high risk pregnancy,  antepartum Feels well. Enc po hydration with water.  2. Gestational diabetes mellitus (GDM) during puerperium controlled on oral hypoglycemic therapy NST today is reactive/reassuring for gestational age. UA neg for glucose. Pt states compliance with metformin 500mg  2 po bid. Reviewed diet, generally appropriate, though still including more bread products than ideal - counseled to reduce. Reviewed home BS log: all FBS at goal < 95; 2h pp 95-130, with >50% >120. Will continue to focus on oral medication and diet, enc increased activity, as she is so close to goal with pp readings. Continue twice weekly NST and del at 39 wk. - Urinalysis (Urine Dip)  3. Previous cesarean section x2: 05/02/12 LTCS & 2016 To keep appts for C/S prep testing and 09/29/20 C/S as sched.   Term labor symptoms and general obstetric precautions including but not limited to vaginal bleeding, contractions, leaking of fluid and fetal movement were reviewed in detail with the patient. Please refer to After Visit Summary for other counseling recommendations.  Return in about 4 days (around 09/21/2020) for NST, and 1 week for RV/routine prenatal care.  Future Appointments  Date Time Provider Department Center  09/21/2020  8:40 AM [facility] PROVIDER [facility] None  09/22/2020  1:00 PM [facility] PAT[facility] None  09/24/2020  9:20 AM [facility] PROVIDER AC[facility] None  09/28/2020  8:10 AM [facility] None  10/01/2020 11:10 AM Schuman, 12/01/2020, MD [facility] None    Jaquelyn Bitter, PA-C

## 2020-09-21 ENCOUNTER — Other Ambulatory Visit: Payer: Self-pay

## 2020-09-21 ENCOUNTER — Ambulatory Visit: Payer: Self-pay | Admitting: Family Medicine

## 2020-09-21 VITALS — BP 139/83 | HR 68 | Temp 97.6°F | Wt 182.6 lb

## 2020-09-21 DIAGNOSIS — O0993 Supervision of high risk pregnancy, unspecified, third trimester: Secondary | ICD-10-CM

## 2020-09-21 LAB — URINALYSIS
Bilirubin, UA: NEGATIVE
Glucose, UA: NEGATIVE
Ketones, UA: NEGATIVE
Leukocytes,UA: NEGATIVE
Nitrite, UA: NEGATIVE
Protein,UA: NEGATIVE
RBC, UA: NEGATIVE
Specific Gravity, UA: 1.02 (ref 1.005–1.030)
Urobilinogen, Ur: 0.2 mg/dL (ref 0.2–1.0)
pH, UA: 6 (ref 5.0–7.5)

## 2020-09-21 NOTE — Progress Notes (Signed)
Per client, had onset of HA yesterday from 1800 - 2300 with nausea, no emesis. Relieved with Tylenol. Denies experiencing blurry vision / vision changes and no abd pain except for slight pain with short contractions. This am client denies feelings of pressure, bleeding or loss of fluid. Reports occasional short contraction (maybe 1 - 2 per hour) and endorses fetal movement as usual. Dr. Alvester Morin notified of above and per her verbal order following labs obtained: urine dip (WNL), urine spot and PIH panel. Client counseled to go immediately to hospital if HA recurs, has vision changes, N, V, loss of fluid, belly / side pain, decreased fetal movement. Client verbalized understanding of above. NST monitoring initiated today and discontinued after strip read as reactive by Dr. Alvester Morin (Geralyn Flash RN, Nursing Supervisor sent copy of NST strip via phone). Jossie Ng, RN

## 2020-09-22 ENCOUNTER — Encounter
Admission: RE | Admit: 2020-09-22 | Discharge: 2020-09-22 | Disposition: A | Discharge status: Home / Self Care | Payer: Self-pay | Source: Ambulatory Visit | Attending: Obstetrics and Gynecology | Admitting: Obstetrics and Gynecology

## 2020-09-22 ENCOUNTER — Other Ambulatory Visit: Payer: Self-pay

## 2020-09-22 LAB — AST+BUN+CREAT+LD+URIC A+HGB...
AST: 20 IU/L (ref 0–40)
BUN: 9 mg/dL (ref 6–24)
Creatinine, Ser: 0.52 mg/dL — ABNORMAL LOW (ref 0.57–1.00)
Hematocrit: 37.8 % (ref 34.0–46.6)
Hemoglobin: 12.8 g/dL (ref 11.1–15.9)
LDH: 182 IU/L (ref 119–226)
Platelets: 195 10*3/uL (ref 150–450)
Uric Acid: 5.8 mg/dL (ref 2.6–6.2)
eGFR: 119 mL/min/{1.73_m2} (ref >59)

## 2020-09-22 LAB — PROTEIN / CREATININE RATIO, URINE
Creatinine, Urine: 67.2 mg/dL
Protein, Ur: 17 mg/dL
Protein/Creat Ratio: 253 mg/g creat — ABNORMAL HIGH (ref 0–200)

## 2020-09-22 NOTE — Progress Notes (Signed)
Attestation: I agree with the advice given to this patient by our nurse staff.  I have reviewed the RN's note and chart. Documentation reflects my recommendations which were given verbally to the nurse at the point of care.   I was consulting via phone due to a provider call out and being off site.   Reviewed NST in real time- Reactive  Due to c/o HA, nausea and mild range BP (though repeat was WNL), we erred on the side of safety and collected PIH panel. UA at the point of care was negative for protein and PIH panel was reviewed the next day and found to be WNL.   Urine protein-Creatinine is still pending and will follow up with Lab personnel.   Patient has close interval follow up. If BP at next visit is > 140/90 I recommend assessment at the hospital. She had a CS scheduled for 8/30   Follow up scheduled:  Future Appointments  Date Time Provider Department Center  09/22/2020  1:00 PM [facility] PAT[facility] None  09/24/2020  9:20 AM [facility] PROVIDER AC[facility]MAT None  09/28/2020  8:10 AM [facility] None  10/01/2020 11:10 AM Schuman, Jaquelyn Bitter, MD [facility] None     Federico Flake, MD, MPH, ABFM Medical Director  [facility] Department

## 2020-09-22 NOTE — Patient Instructions (Addendum)
Your procedure is scheduled on: 09/29/20 Report to The Emergency Department at 5:30 am [facility] [phone]  Remember: Instructions that are not followed completely may result in serious medical risk, up to and including death, or upon the discretion of your surgeon and anesthesiologist your surgery may need to be rescheduled.     _X__ 1. Do not eat food or drinks after midnight the night before your procedure.                 No gum chewing or hard candies. Stop eating or drinking liquids by midnight Monday 8/29  __X__2.  On the morning of surgery brush your teeth with toothpaste and water, you                 may rinse your mouth with mouthwash if you wish.  Do not swallow any              toothpaste of mouthwash.     _X__ 3.  No Alcohol for 24 hours before or after surgery.   _X__ 4.  Do Not Smoke or use e-cigarettes For 24 Hours Prior to Your Surgery.                 Do not use any chewable tobacco products for at least 6 hours prior to                 surgery.  ____  5.  Bring all medications with you on the day of surgery if instructed.   __X__  6.  Notify your doctor if there is any change in your medical condition      (cold, fever, infections).     Do not wear jewelry, make-up, hairpins, clips or nail polish. Do not wear lotions, powders, or perfumes.  Do not shave 48 hours prior to surgery. Men may shave face and neck. Do not bring valuables to the hospital.    [facility] is not responsible for any belongings or valuables.  Contacts, dentures/partials or body piercings may not be worn into surgery. Bring a case for your contacts, glasses or hearing aids, a denture cup will be supplied. Leave your suitcase in the car. After surgery it may be brought to your room. For patients admitted to the hospital, discharge time is determined by your treatment team.   Patients discharged the day of surgery will not be allowed to drive home.   Please read over the following fact  sheets that you were given:   MRSA Information, CHG/SAGE wipes  __X__ Take these medicines the morning of surgery with A SIP OF WATER:    1. none  2.   3.   4.  5.  6.  ____ Fleet Enema (as directed)   __X__ Use CHG Soap/SAGE wipes as directed  ____ Use inhalers on the day of surgery  __x__ Stop metformin/Janumet/Farxiga 2 days prior to surgery    ____ Take 1/2 of usual insulin dose the night before surgery. No insulin the morning          of surgery.   ____ Stop Blood Thinners Coumadin/Plavix/Xarelto/Pleta/Pradaxa/Eliquis/Effient/Aspirin  on   Or contact your Surgeon, Cardiologist or Medical Doctor regarding  ability to stop your blood thinners  __X__ Stop Anti-inflammatories 7 days before surgery such as Advil, Ibuprofen, Motrin,  BC or Goodies Powder, Naprosyn, Naproxen, Aleve, Aspirin    __X__ Stop all herbal supplements, fish oil or vitamin E until after surgery.    ____ Bring C-Pap to  the hospital.      Su procedimiento est programado para: 30/8/22 Informe al Narda Rutherford de Emergencia a las 5:30 a.m. Lugar de nacimiento [phone]  Recuerde: las instrucciones que no se siguen por Airline pilot en un riesgo mdico grave, que puede incluir la [place], o segn el criterio de su cirujano y Scientific laboratory technician, es posible que sea Aeronautical engineer su Leisure centre manager.    _X__ 1. No ingiera alimentos ni bebidas despus de la medianoche anterior a su procedimiento.                 No masticar chicle ni caramelos duros. Deje de comer o beber lquidos antes de la medianoche del lunes 8/29  __X__2. En la maana de la ciruga cepllese los dientes con pasta dental y agua, [place] enjuagarse la boca con enjuague bucal si lo desea. No trague ninguna pasta de dientes o enjuague bucal.   _X__ 3. Nada de alcohol por 24 horas antes o despus de la [place].   _X__ 4. No fume ni use cigarrillos electrnicos durante las 24 horas previas a su [place].                 No use ningn  producto de tabaco masticable durante al menos 6 horas antes de                 [place].  ____ 5. Traiga todos los medicamentos con usted el da de la ciruga si se lo indicaron.  __X__ 6. Notifique a su mdico si hay algn cambio en su condicin mdica (resfriado, fiebre, infecciones).  No use joyas, maquillaje, horquillas para el cabello, clips o esmalte de uas. No use lociones, talcos ni perfumes. No se afeite 48 horas antes de la [place]. Los hombres pueden [employer] cara y el cuello. No lleve objetos de valor al hospital.  [facility] no es responsable de ninguna pertenencia u objeto de Licensed conveyancer.  No se pueden usar lentes de contacto, dentaduras postizas/parciales o perforaciones corporales en la ciruga. Traiga un estuche para sus lentes de contacto, anteojos o audfonos, se le proporcionar una copa para dentadura postiza. Deja tu maleta en el coche. Despus de la [place], es posible que lo lleven a su habitacin. Para los pacientes ingresados en el hospital, la hora del alta la determina su equipo de tratamiento  Los pacientes dados de alta el da de la ciruga no podrn Science writer a Higher education careers adviser.  Por favor, lea las siguientes hojas informativas que le dieron: Informacin sobre MRSA, toallitas CHG/SAGE  __X__ [employer] la maana de la ciruga con UN SORBO DE AGUA:  nada 2. 3. 4. 5. 6.  ____ Fleet Enema (segn las indicaciones)  __X__ Use CHG Soap/SAGE toallitas como se indica  ____ Usar inhaladores el da de la ciruga  __x__ Suspender metformina/Janumet/Farxiga 2 das antes de la ciruga  ____ [employer] mitad de la dosis habitual de insulina la noche anterior a la Leisure centre manager. Sin insulina por la maana          de [place]  ____ Suspenda los anticoagulantes Coumadin/Plavix/Xarelto/Pleta/Pradaxa/Eliquis/Effient/Aspirin on O comunquese con su cirujano, cardilogo o mdico acerca de la capacidad para TEFL teacher sus anticoagulantes  __X__ [employer]  antiinflamatorios 7 das antes de la ciruga como Advil, Ibuprofen, Motrin, BC o Goodies Powder, Naprosyn, Naproxen, Aleve, Aspirin  __X__ Suspenda todos los suplementos de hierbas, aceite de pescado o vitamina E hasta despus de la [place].  ____ Lenard Forth al hospital.

## 2020-09-22 NOTE — Pre-Procedure Instructions (Signed)
Interview and instructions provided verbally with assistance interpreter "M" [phone] . Written instructions given. All questions answered. Patient to return 8/29 at 9:30 am for labs and covid screen. Total time spent with patient approx 55 minutes.

## 2020-09-24 ENCOUNTER — Telehealth: Payer: Self-pay

## 2020-09-24 ENCOUNTER — Ambulatory Visit: Payer: Self-pay | Admitting: Physician Assistant

## 2020-09-24 ENCOUNTER — Other Ambulatory Visit: Payer: Self-pay

## 2020-09-24 ENCOUNTER — Encounter: Payer: Self-pay | Admitting: Physician Assistant

## 2020-09-24 VITALS — BP 127/78 | HR 77 | Temp 97.1°F | Wt 184.0 lb

## 2020-09-24 DIAGNOSIS — Z98891 History of uterine scar from previous surgery: Secondary | ICD-10-CM

## 2020-09-24 DIAGNOSIS — O24419 Gestational diabetes mellitus in pregnancy, unspecified control: Secondary | ICD-10-CM

## 2020-09-24 DIAGNOSIS — O0993 Supervision of high risk pregnancy, unspecified, third trimester: Secondary | ICD-10-CM

## 2020-09-24 LAB — URINALYSIS
Bilirubin, UA: NEGATIVE
Glucose, UA: NEGATIVE
Ketones, UA: NEGATIVE
Leukocytes,UA: NEGATIVE
Nitrite, UA: NEGATIVE
Protein,UA: NEGATIVE
RBC, UA: NEGATIVE
Specific Gravity, UA: 1.02 (ref 1.005–1.030)
Urobilinogen, Ur: 0.2 mg/dL (ref 0.2–1.0)
pH, UA: 6 (ref 5.0–7.5)

## 2020-09-24 MED ORDER — PRENATAL VITAMIN 27-0.8 MG PO TABS: 1.0000 | ORAL_TABLET | Freq: Every day | ORAL | 0 refills | Status: AC

## 2020-09-24 NOTE — Progress Notes (Signed)
   PRENATAL VISIT NOTE  Subjective:  Tara Chavez is a 40 y.o. G3P2002 at [redacted]w[redacted]d being seen today for ongoing prenatal care.  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  Patient reports no complaints.  Contractions: Irregular. Vag. Bleeding: None.  Movement: Present. Denies leaking of fluid/ROM.   The following portions of the patient's history were reviewed and updated as appropriate: allergies, current medications, past family history, past medical history, past social history, past surgical history and problem list. Problem list updated.  Objective:   Vitals:   09/24/20 0947  BP: 127/78  Pulse: 77  Temp: (!) 97.1 F (36.2 C)  Weight: 184 lb (83.5 kg)    Fetal Status: Fetal Heart Rate (bpm): 136 Fundal Height: 40 cm Movement: Present  Presentation: Vertex  General:  Alert, oriented and cooperative. Patient is in no acute distress.  Skin: Skin is warm and dry. No rash noted.   Cardiovascular: Normal heart rate noted  Respiratory: Normal respiratory effort, no problems with respiration noted  Abdomen: Soft, gravid, appropriate for gestational age.  Pain/Pressure: Absent     Pelvic: Cervical exam deferred        Extremities: Normal range of motion.  Edema: None  Mental Status: Normal mood and affect. Normal behavior. Normal judgment and thought content.   Assessment and Plan:  Pregnancy: G3P2002 at [redacted]w[redacted]d  1. Supervision of high risk  pregnancy in third trimester Doing well. More PNV given today. - Urinalysis - Prenatal Vit-Fe Fumarate-FA (PRENATAL VITAMIN) 27-0.8 MG TABS; Take 1 tablet by mouth daily.  Dispense: 100 tablet; Refill: 0  2. Gestational diabetes mellitus (GDM) affecting pregnancy Reviewed BS log: FBS 76-88, 2h pp 100-125 with only 3 readings at or above 120, which is improved from prior week. She has been eating fewer tortillas. I reinforced this positive diet change. Still no indication for medication for GDM. Continue BS monitoring, diet, and enc walking. NST reactive today. Plan to repeat NST on 8/29 (here or possibly at hosp, as pt states she will have trouble coming here due to preop appt and first day of school for her other children), with del 09/29/20. Called [facility] L&D and spoke with charge nurse Jacki Cones, to sched NST at L&D at 10:30 on 09/28/20 immediately following pre-op lab appt at 9:30 that morning. - Urinalysis  3. Previous cesarean section x2: 05/02/12 LTCS & 2016 Sched for preop 8/29 and repeat C/S 09/29/20.  Due to language barrier, an interpreter  Juliene Pina) was present during the provider portion of the visit with this patient.  Please refer to After Visit Summary for other counseling recommendations.  Return in about 4 days (around 09/28/2020) for NST.  Future Appointments  Date Time Provider Department Center  09/28/2020  9:30 AM [facility] None  10/01/2020 11:10 AM Schuman, Jaquelyn Bitter, MD [facility] None    Landry Dyke, PA-C

## 2020-09-24 NOTE — Telephone Encounter (Signed)
Contacted patient regarding arrangement with [facility] L&D for NST only. Patient unable to get NST at [facility] Monday due to her children starting school Monday and patient agrees that after her pre-admit labs at 09:30 she will report to L&D for NST. Provider A. Streilein PA-C spoke with charge nurse Lawson Fiscal regarding this arrangement, patient to arrive around 10:30am.   Floy Sabina, RN

## 2020-09-24 NOTE — Progress Notes (Addendum)
Patient here for MH RV and NST at 38 6/7. Urine dip today and BS log copied. Aware of C-section on 09/29/2020 at 0600.Marland Kitchen Patient has pre-admission testing at 9:30 on 09/28/2020, and states she is very busy that day. Per provider, patient needs NST that day, but patient states she is really busy and can't come for NST on 09/28/2020.Marland KitchenMarland KitchenBurt Knack, RN

## 2020-09-28 ENCOUNTER — Inpatient Hospital Stay: Payer: Medicaid Other | Admitting: Urgent Care

## 2020-09-28 ENCOUNTER — Other Ambulatory Visit: Payer: Self-pay

## 2020-09-28 ENCOUNTER — Encounter: Payer: Self-pay | Admitting: Obstetrics and Gynecology

## 2020-09-28 ENCOUNTER — Other Ambulatory Visit: Admission: RE | Admit: 2020-09-28 | Payer: Self-pay | Source: Ambulatory Visit

## 2020-09-28 ENCOUNTER — Inpatient Hospital Stay: Payer: Medicaid Other | Admitting: Certified Registered"

## 2020-09-28 ENCOUNTER — Encounter
Admission: EM | Disposition: A | Discharge status: Home / Self Care | Payer: Self-pay | Source: Home / Self Care | Attending: Obstetrics and Gynecology

## 2020-09-28 ENCOUNTER — Inpatient Hospital Stay
Admission: EM | Admit: 2020-09-28 | Discharge: 2020-09-30 | DRG: 784 | Disposition: A | Discharge status: Home / Self Care | Payer: Medicaid Other | Attending: Obstetrics and Gynecology | Admitting: Obstetrics and Gynecology

## 2020-09-28 DIAGNOSIS — O9081 Anemia of the puerperium: Secondary | ICD-10-CM | POA: Diagnosis not present

## 2020-09-28 DIAGNOSIS — D62 Acute posthemorrhagic anemia: Secondary | ICD-10-CM | POA: Diagnosis not present

## 2020-09-28 DIAGNOSIS — O26893 Other specified pregnancy related conditions, third trimester: Secondary | ICD-10-CM | POA: Diagnosis present

## 2020-09-28 DIAGNOSIS — Z302 Encounter for sterilization: Secondary | ICD-10-CM

## 2020-09-28 DIAGNOSIS — Z3A39 39 weeks gestation of pregnancy: Secondary | ICD-10-CM

## 2020-09-28 DIAGNOSIS — O99214 Obesity complicating childbirth: Secondary | ICD-10-CM | POA: Diagnosis present

## 2020-09-28 DIAGNOSIS — O34211 Maternal care for low transverse scar from previous cesarean delivery: Secondary | ICD-10-CM | POA: Diagnosis present

## 2020-09-28 DIAGNOSIS — Z87891 Personal history of nicotine dependence: Secondary | ICD-10-CM

## 2020-09-28 DIAGNOSIS — O1414 Severe pre-eclampsia complicating childbirth: Principal | ICD-10-CM

## 2020-09-28 DIAGNOSIS — O99824 Streptococcus B carrier state complicating childbirth: Secondary | ICD-10-CM | POA: Diagnosis present

## 2020-09-28 DIAGNOSIS — O24425 Gestational diabetes mellitus in childbirth, controlled by oral hypoglycemic drugs: Secondary | ICD-10-CM | POA: Diagnosis present

## 2020-09-28 DIAGNOSIS — O24435 Gestational diabetes mellitus in puerperium, controlled by oral hypoglycemic drugs: Secondary | ICD-10-CM

## 2020-09-28 DIAGNOSIS — Z98891 History of uterine scar from previous surgery: Secondary | ICD-10-CM

## 2020-09-28 DIAGNOSIS — B951 Streptococcus, group B, as the cause of diseases classified elsewhere: Secondary | ICD-10-CM

## 2020-09-28 DIAGNOSIS — O1413 Severe pre-eclampsia, third trimester: Secondary | ICD-10-CM

## 2020-09-28 DIAGNOSIS — O34219 Maternal care for unspecified type scar from previous cesarean delivery: Secondary | ICD-10-CM

## 2020-09-28 DIAGNOSIS — O0990 Supervision of high risk pregnancy, unspecified, unspecified trimester: Secondary | ICD-10-CM

## 2020-09-28 DIAGNOSIS — O479 False labor, unspecified: Secondary | ICD-10-CM | POA: Diagnosis present

## 2020-09-28 DIAGNOSIS — O99210 Obesity complicating pregnancy, unspecified trimester: Secondary | ICD-10-CM | POA: Diagnosis present

## 2020-09-28 DIAGNOSIS — O09523 Supervision of elderly multigravida, third trimester: Secondary | ICD-10-CM

## 2020-09-28 DIAGNOSIS — Z20822 Contact with and (suspected) exposure to covid-19: Secondary | ICD-10-CM | POA: Diagnosis present

## 2020-09-28 DIAGNOSIS — O09529 Supervision of elderly multigravida, unspecified trimester: Secondary | ICD-10-CM

## 2020-09-28 LAB — CBC
HCT: 34 % — ABNORMAL LOW (ref 36.0–46.0)
Hemoglobin: 11.4 g/dL — ABNORMAL LOW (ref 12.0–15.0)
MCH: 31.1 pg (ref 26.0–34.0)
MCHC: 33.5 g/dL (ref 30.0–36.0)
MCV: 92.6 fL (ref 80.0–100.0)
Platelets: 186 10*3/uL (ref 150–400)
RBC: 3.67 MIL/uL — ABNORMAL LOW (ref 3.87–5.11)
RDW: 13.6 % (ref 11.5–15.5)
WBC: 8 10*3/uL (ref 4.0–10.5)
nRBC: 0 % (ref 0.0–0.2)

## 2020-09-28 LAB — SAMPLE TO BLOOD BANK

## 2020-09-28 LAB — COMPREHENSIVE METABOLIC PANEL
ALT: 17 U/L (ref 0–44)
AST: 29 U/L (ref 15–41)
Albumin: 3 g/dL — ABNORMAL LOW (ref 3.5–5.0)
Alkaline Phosphatase: 189 U/L — ABNORMAL HIGH (ref 38–126)
Anion gap: 6 (ref 5–15)
BUN: 14 mg/dL (ref 6–20)
CO2: 20 mmol/L — ABNORMAL LOW (ref 22–32)
Calcium: 8.5 mg/dL — ABNORMAL LOW (ref 8.9–10.3)
Chloride: 108 mmol/L (ref 98–111)
Creatinine, Ser: 0.59 mg/dL (ref 0.44–1.00)
GFR, Estimated: >60 mL/min (ref >60)
Glucose, Bld: 145 mg/dL — ABNORMAL HIGH (ref 70–99)
Potassium: 3.7 mmol/L (ref 3.5–5.1)
Sodium: 134 mmol/L — ABNORMAL LOW (ref 135–145)
Total Bilirubin: 0.6 mg/dL (ref 0.3–1.2)
Total Protein: 6.2 g/dL — ABNORMAL LOW (ref 6.5–8.1)

## 2020-09-28 LAB — TYPE AND SCREEN
ABO/RH(D): O POS
ABO/RH(D): O POS
Antibody Screen: NEGATIVE
Antibody Screen: NEGATIVE

## 2020-09-28 LAB — CBC WITH DIFFERENTIAL/PLATELET
Abs Immature Granulocytes: 0.02 10*3/uL (ref 0.00–0.07)
Basophils Absolute: 0 10*3/uL (ref 0.0–0.1)
Basophils Relative: 0 %
Eosinophils Absolute: 0.1 10*3/uL (ref 0.0–0.5)
Eosinophils Relative: 1 %
HCT: 34.9 % — ABNORMAL LOW (ref 36.0–46.0)
Hemoglobin: 12.1 g/dL (ref 12.0–15.0)
Immature Granulocytes: 0 %
Lymphocytes Relative: 40 %
Lymphs Abs: 2.6 10*3/uL (ref 0.7–4.0)
MCH: 31.8 pg (ref 26.0–34.0)
MCHC: 34.7 g/dL (ref 30.0–36.0)
MCV: 91.8 fL (ref 80.0–100.0)
Monocytes Absolute: 0.3 10*3/uL (ref 0.1–1.0)
Monocytes Relative: 5 %
Neutro Abs: 3.5 10*3/uL (ref 1.7–7.7)
Neutrophils Relative %: 54 %
Platelets: 192 10*3/uL (ref 150–400)
RBC: 3.8 MIL/uL — ABNORMAL LOW (ref 3.87–5.11)
RDW: 13.5 % (ref 11.5–15.5)
WBC: 6.5 10*3/uL (ref 4.0–10.5)
nRBC: 0 % (ref 0.0–0.2)

## 2020-09-28 LAB — RESP PANEL BY RT-PCR (FLU A&B, COVID) ARPGX2
Influenza A by PCR: NEGATIVE
Influenza B by PCR: NEGATIVE
SARS Coronavirus 2 by RT PCR: NEGATIVE

## 2020-09-28 LAB — PROTEIN / CREATININE RATIO, URINE
Creatinine, Urine: 51 mg/dL
Protein Creatinine Ratio: 0.51 mg/mg{Cre} — ABNORMAL HIGH (ref 0.00–0.15)
Total Protein, Urine: 26 mg/dL

## 2020-09-28 LAB — GLUCOSE, CAPILLARY
Glucose-Capillary: 132 mg/dL — ABNORMAL HIGH (ref 70–99)
Glucose-Capillary: 116 mg/dL — ABNORMAL HIGH (ref 70–99)
Glucose-Capillary: 137 mg/dL — ABNORMAL HIGH (ref 70–99)

## 2020-09-28 LAB — MAGNESIUM: Magnesium: 4.5 mg/dL — ABNORMAL HIGH (ref 1.7–2.4)

## 2020-09-28 LAB — RAPID HIV SCREEN (HIV 1/2 AB+AG)
HIV 1/2 Antibodies: NONREACTIVE
HIV-1 P24 Antigen - HIV24: NONREACTIVE

## 2020-09-28 LAB — ABO/RH: ABO/RH(D): O POS

## 2020-09-28 LAB — SYPHILIS: RPR W/REFLEX TO RPR TITER AND TREPONEMAL ANTIBODIES, TRADITIONAL SCREENING AND DIAGNOSIS ALGORITHM: RPR Ser Ql: NONREACTIVE

## 2020-09-28 SURGERY — Surgical Case
Anesthesia: Spinal

## 2020-09-28 SURGERY — Surgical Case
Anesthesia: Choice | Laterality: Bilateral

## 2020-09-28 MED ORDER — NALBUPHINE HCL 10 MG/ML IJ SOLN: 5.0000 mg | INTRAMUSCULAR | Status: DC | PRN

## 2020-09-28 MED ORDER — LABETALOL HCL 5 MG/ML IV SOLN
INTRAVENOUS | Status: AC
  Filled 2020-09-28: qty 4

## 2020-09-28 MED ORDER — METHYLERGONOVINE MALEATE 0.2 MG/ML IJ SOLN
INTRAMUSCULAR | Status: AC
  Filled 2020-09-28: qty 1

## 2020-09-28 MED ORDER — OXYCODONE-ACETAMINOPHEN 5-325 MG PO TABS: 2.0000 | ORAL_TABLET | ORAL | Status: DC | PRN

## 2020-09-28 MED ORDER — SODIUM CHLORIDE 0.9 % IV SOLN
INTRAVENOUS | Status: AC
  Filled 2020-09-28: qty 500

## 2020-09-28 MED ORDER — OXYTOCIN BOLUS FROM INFUSION: 333.0000 mL | Freq: Once | INTRAVENOUS | Status: DC

## 2020-09-28 MED ORDER — CEFAZOLIN SODIUM-DEXTROSE 2-4 GM/100ML-% IV SOLN: 2.0000 g | INTRAVENOUS | Status: DC

## 2020-09-28 MED ORDER — SOD CITRATE-CITRIC ACID 500-334 MG/5ML PO SOLN
ORAL | Status: AC
  Filled 2020-09-28: qty 15

## 2020-09-28 MED ORDER — BUPIVACAINE 0.25 % ON-Q PUMP DUAL CATH 400 ML
400.0000 mL | INJECTION | Status: DC
  Filled 2020-09-28: qty 400

## 2020-09-28 MED ORDER — SODIUM CHLORIDE 0.9% FLUSH: 3.0000 mL | INTRAVENOUS | Status: DC | PRN

## 2020-09-28 MED ORDER — WITCH HAZEL-GLYCERIN EX PADS: 1.0000 "application " | MEDICATED_PAD | CUTANEOUS | Status: DC | PRN

## 2020-09-28 MED ORDER — MENTHOL 3 MG MT LOZG
1.0000 | LOZENGE | OROMUCOSAL | Status: DC | PRN
  Filled 2020-09-28: qty 9

## 2020-09-28 MED ORDER — LACTATED RINGERS IV BOLUS
250.0000 mL | Freq: Once | INTRAVENOUS | Status: AC
  Administered 2020-09-28: 250 mL via INTRAVENOUS

## 2020-09-28 MED ORDER — BUPIVACAINE HCL (PF) 0.5 % IJ SOLN
INTRAMUSCULAR | Status: AC
  Filled 2020-09-28: qty 30

## 2020-09-28 MED ORDER — NALBUPHINE HCL 10 MG/ML IJ SOLN: 5.0000 mg | Freq: Once | INTRAMUSCULAR | Status: AC | PRN

## 2020-09-28 MED ORDER — MAGNESIUM SULFATE 40 GM/1000ML IV SOLN
2.0000 g/h | INTRAVENOUS | Status: DC
  Administered 2020-09-29: 2 g/h via INTRAVENOUS
  Filled 2020-09-28: qty 1000

## 2020-09-28 MED ORDER — LABETALOL HCL 5 MG/ML IV SOLN: 20.0000 mg | INTRAVENOUS | Status: DC | PRN

## 2020-09-28 MED ORDER — LABETALOL HCL 5 MG/ML IV SOLN
40.0000 mg | INTRAVENOUS | Status: DC | PRN
  Administered 2020-09-28: 40 mg via INTRAVENOUS
  Filled 2020-09-28: qty 8

## 2020-09-28 MED ORDER — PRENATAL MULTIVITAMIN CH
ORAL_TABLET | ORAL | Status: AC
  Administered 2020-09-28: 1 via ORAL
  Filled 2020-09-28: qty 1

## 2020-09-28 MED ORDER — CEFAZOLIN SODIUM-DEXTROSE 2-4 GM/100ML-% IV SOLN
INTRAVENOUS | Status: AC
  Filled 2020-09-28: qty 100

## 2020-09-28 MED ORDER — OXYTOCIN-SODIUM CHLORIDE 30-0.9 UT/500ML-% IV SOLN
2.5000 [IU]/h | INTRAVENOUS | Status: AC
  Administered 2020-09-28: 2.5 [IU]/h via INTRAVENOUS

## 2020-09-28 MED ORDER — CEFAZOLIN SODIUM-DEXTROSE 2-4 GM/100ML-% IV SOLN
2.0000 g | Freq: Once | INTRAVENOUS | Status: AC
  Administered 2020-09-28: 2 g via INTRAVENOUS

## 2020-09-28 MED ORDER — FENTANYL CITRATE (PF) 100 MCG/2ML IJ SOLN: 25.0000 ug | INTRAMUSCULAR | Status: DC | PRN

## 2020-09-28 MED ORDER — OXYCODONE-ACETAMINOPHEN 5-325 MG PO TABS: 1.0000 | ORAL_TABLET | ORAL | Status: DC | PRN

## 2020-09-28 MED ORDER — DIPHENHYDRAMINE HCL 50 MG/ML IJ SOLN: 12.5000 mg | INTRAMUSCULAR | Status: DC | PRN

## 2020-09-28 MED ORDER — LACTATED RINGERS IV SOLN: INTRAVENOUS | Status: DC

## 2020-09-28 MED ORDER — OXYTOCIN-SODIUM CHLORIDE 30-0.9 UT/500ML-% IV SOLN
INTRAVENOUS | Status: AC
  Filled 2020-09-28: qty 500

## 2020-09-28 MED ORDER — SODIUM CHLORIDE (PF) 0.9 % IJ SOLN
INTRAMUSCULAR | Status: AC
  Filled 2020-09-28: qty 50

## 2020-09-28 MED ORDER — DIBUCAINE (PERIANAL) 1 % EX OINT: 1.0000 "application " | TOPICAL_OINTMENT | CUTANEOUS | Status: DC | PRN

## 2020-09-28 MED ORDER — NALOXONE HCL 0.4 MG/ML IJ SOLN: 0.4000 mg | INTRAMUSCULAR | Status: DC | PRN

## 2020-09-28 MED ORDER — PROCHLORPERAZINE EDISYLATE 10 MG/2ML IJ SOLN
10.0000 mg | Freq: Once | INTRAMUSCULAR | Status: AC
  Administered 2020-09-28: 10 mg via INTRAVENOUS
  Filled 2020-09-28: qty 2

## 2020-09-28 MED ORDER — NALBUPHINE HCL 10 MG/ML IJ SOLN
5.0000 mg | Freq: Once | INTRAMUSCULAR | Status: AC | PRN
  Administered 2020-09-28: 5 mg via INTRAVENOUS

## 2020-09-28 MED ORDER — ACETAMINOPHEN 500 MG PO TABS
1000.0000 mg | ORAL_TABLET | Freq: Four times a day (QID) | ORAL | Status: DC | PRN
  Administered 2020-09-28 – 2020-09-30 (×6): 1000 mg via ORAL
  Filled 2020-09-28 (×6): qty 2

## 2020-09-28 MED ORDER — MORPHINE SULFATE (PF) 0.5 MG/ML IJ SOLN
INTRAMUSCULAR | Status: DC | PRN
  Administered 2020-09-28: .2 mg via EPIDURAL

## 2020-09-28 MED ORDER — MEPERIDINE HCL 25 MG/ML IJ SOLN: 6.2500 mg | INTRAMUSCULAR | Status: DC | PRN

## 2020-09-28 MED ORDER — FENTANYL CITRATE (PF) 100 MCG/2ML IJ SOLN
INTRAMUSCULAR | Status: AC
  Filled 2020-09-28: qty 2

## 2020-09-28 MED ORDER — BUPIVACAINE LIPOSOME 1.3 % IJ SUSP
INTRAMUSCULAR | Status: AC
  Filled 2020-09-28: qty 20

## 2020-09-28 MED ORDER — CARBOPROST TROMETHAMINE 250 MCG/ML IM SOLN
INTRAMUSCULAR | Status: AC
  Filled 2020-09-28: qty 1

## 2020-09-28 MED ORDER — MAGNESIUM SULFATE 40 GM/1000ML IV SOLN
INTRAVENOUS | Status: AC
  Administered 2020-09-28: 2 g/h via INTRAVENOUS
  Filled 2020-09-28: qty 1000

## 2020-09-28 MED ORDER — HYDRALAZINE HCL 20 MG/ML IJ SOLN: 10.0000 mg | INTRAMUSCULAR | Status: DC | PRN

## 2020-09-28 MED ORDER — SENNOSIDES-DOCUSATE SODIUM 8.6-50 MG PO TABS
2.0000 | ORAL_TABLET | ORAL | Status: DC
  Administered 2020-09-28 – 2020-09-30 (×3): 2 via ORAL
  Filled 2020-09-28 (×3): qty 2

## 2020-09-28 MED ORDER — ONDANSETRON HCL 4 MG/2ML IJ SOLN
INTRAMUSCULAR | Status: DC | PRN
  Administered 2020-09-28: 4 mg via INTRAVENOUS

## 2020-09-28 MED ORDER — SIMETHICONE 80 MG PO CHEW
80.0000 mg | CHEWABLE_TABLET | Freq: Three times a day (TID) | ORAL | Status: DC
  Administered 2020-09-28 – 2020-09-30 (×8): 80 mg via ORAL
  Filled 2020-09-28 (×8): qty 1

## 2020-09-28 MED ORDER — SOD CITRATE-CITRIC ACID 500-334 MG/5ML PO SOLN
30.0000 mL | ORAL | Status: DC | PRN
  Administered 2020-09-28: 30 mL via ORAL

## 2020-09-28 MED ORDER — BUPIVACAINE IN DEXTROSE 0.75-8.25 % IT SOLN
INTRATHECAL | Status: DC | PRN
  Administered 2020-09-28: 1.4 mL via INTRATHECAL

## 2020-09-28 MED ORDER — NALBUPHINE HCL 10 MG/ML IJ SOLN
5.0000 mg | INTRAMUSCULAR | Status: DC | PRN
Start: 2020-09-28 — End: 2020-09-30
  Filled 2020-09-28: qty 1

## 2020-09-28 MED ORDER — SODIUM CHLORIDE 0.9 % IV SOLN
INTRAVENOUS | Status: DC | PRN
  Administered 2020-09-28: 50 ug/min via INTRAVENOUS

## 2020-09-28 MED ORDER — FERROUS SULFATE 325 (65 FE) MG PO TABS
325.0000 mg | ORAL_TABLET | Freq: Two times a day (BID) | ORAL | Status: DC
  Administered 2020-09-28 – 2020-09-30 (×5): 325 mg via ORAL
  Filled 2020-09-28 (×5): qty 1

## 2020-09-28 MED ORDER — CALCIUM GLUCONATE 10 % IV SOLN
INTRAVENOUS | Status: AC
  Filled 2020-09-28: qty 10

## 2020-09-28 MED ORDER — ONDANSETRON HCL 4 MG/2ML IJ SOLN: 4.0000 mg | Freq: Three times a day (TID) | INTRAMUSCULAR | Status: DC | PRN

## 2020-09-28 MED ORDER — PRENATAL MULTIVITAMIN CH
1.0000 | ORAL_TABLET | Freq: Every day | ORAL | Status: DC
  Administered 2020-09-29 – 2020-09-30 (×2): 1 via ORAL
  Filled 2020-09-28 (×2): qty 1

## 2020-09-28 MED ORDER — ZOLPIDEM TARTRATE 5 MG PO TABS
5.0000 mg | ORAL_TABLET | Freq: Every evening | ORAL | Status: DC | PRN
  Administered 2020-09-28: 5 mg via ORAL
  Filled 2020-09-28: qty 1

## 2020-09-28 MED ORDER — BUPIVACAINE HCL (PF) 0.5 % IJ SOLN
INTRAMUSCULAR | Status: DC | PRN
  Administered 2020-09-28: 10 mL

## 2020-09-28 MED ORDER — LACTATED RINGERS IV SOLN: 500.0000 mL | INTRAVENOUS | Status: DC | PRN

## 2020-09-28 MED ORDER — DIPHENHYDRAMINE HCL 25 MG PO CAPS: 25.0000 mg | ORAL_CAPSULE | ORAL | Status: DC | PRN

## 2020-09-28 MED ORDER — LABETALOL HCL 5 MG/ML IV SOLN: 80.0000 mg | INTRAVENOUS | Status: DC | PRN

## 2020-09-28 MED ORDER — OXYTOCIN-SODIUM CHLORIDE 30-0.9 UT/500ML-% IV SOLN
2.5000 [IU]/h | INTRAVENOUS | Status: DC
  Administered 2020-09-28: 20 [IU]/h via INTRAVENOUS
  Filled 2020-09-28 (×2): qty 500

## 2020-09-28 MED ORDER — KETOROLAC TROMETHAMINE 30 MG/ML IJ SOLN
30.0000 mg | Freq: Four times a day (QID) | INTRAMUSCULAR | Status: AC | PRN
  Administered 2020-09-28 – 2020-09-29 (×4): 30 mg via INTRAVENOUS
  Filled 2020-09-28 (×4): qty 1

## 2020-09-28 MED ORDER — DIPHENHYDRAMINE HCL 25 MG PO CAPS: 25.0000 mg | ORAL_CAPSULE | Freq: Four times a day (QID) | ORAL | Status: DC | PRN

## 2020-09-28 MED ORDER — LACTATED RINGERS IV SOLN
INTRAVENOUS | Status: DC
  Administered 2020-09-28: 33.3 mL via INTRAVENOUS

## 2020-09-28 MED ORDER — SOD CITRATE-CITRIC ACID 500-334 MG/5ML PO SOLN: 30.0000 mL | ORAL | Status: DC

## 2020-09-28 MED ORDER — BUTORPHANOL TARTRATE 1 MG/ML IJ SOLN
1.0000 mg | INTRAMUSCULAR | Status: DC | PRN
  Administered 2020-09-28: 1 mg via INTRAVENOUS
  Filled 2020-09-28: qty 1

## 2020-09-28 MED ORDER — ONDANSETRON HCL 4 MG/2ML IJ SOLN
4.0000 mg | Freq: Four times a day (QID) | INTRAMUSCULAR | Status: DC | PRN
  Administered 2020-09-28: 4 mg via INTRAVENOUS
  Filled 2020-09-28: qty 2

## 2020-09-28 MED ORDER — NALOXONE HCL 4 MG/10ML IJ SOLN
1.0000 ug/kg/h | INTRAVENOUS | Status: DC | PRN
  Filled 2020-09-28: qty 5

## 2020-09-28 MED ORDER — KETOROLAC TROMETHAMINE 30 MG/ML IJ SOLN: 30.0000 mg | Freq: Four times a day (QID) | INTRAMUSCULAR | Status: AC | PRN

## 2020-09-28 MED ORDER — COCONUT OIL OIL
1.0000 "application " | TOPICAL_OIL | Status: DC | PRN
  Administered 2020-09-30: 1 via TOPICAL
  Filled 2020-09-28 (×2): qty 120

## 2020-09-28 MED ORDER — MISOPROSTOL 200 MCG PO TABS
ORAL_TABLET | ORAL | Status: AC
  Filled 2020-09-28: qty 5

## 2020-09-28 MED ORDER — LACTATED RINGERS IV BOLUS
500.0000 mL | Freq: Once | INTRAVENOUS | Status: AC
  Administered 2020-09-28: 500 mL via INTRAVENOUS

## 2020-09-28 MED ORDER — PROMETHAZINE HCL 25 MG/ML IJ SOLN: 6.2500 mg | INTRAMUSCULAR | Status: DC | PRN

## 2020-09-28 MED ORDER — LIDOCAINE HCL (PF) 1 % IJ SOLN: 30.0000 mL | INTRAMUSCULAR | Status: DC | PRN

## 2020-09-28 MED ORDER — LABETALOL HCL 5 MG/ML IV SOLN
20.0000 mg | Freq: Once | INTRAVENOUS | Status: AC
  Administered 2020-09-28: 20 mg via INTRAVENOUS

## 2020-09-28 MED ORDER — PHENYLEPHRINE HCL (PRESSORS) 10 MG/ML IV SOLN
INTRAVENOUS | Status: AC
  Filled 2020-09-28: qty 1

## 2020-09-28 MED ORDER — MORPHINE SULFATE (PF) 0.5 MG/ML IJ SOLN
INTRAMUSCULAR | Status: AC
  Filled 2020-09-28: qty 10

## 2020-09-28 MED ORDER — ACETAMINOPHEN 325 MG PO TABS: 650.0000 mg | ORAL_TABLET | ORAL | Status: DC | PRN

## 2020-09-28 MED ORDER — MAGNESIUM SULFATE BOLUS VIA INFUSION
4.0000 g | Freq: Once | INTRAVENOUS | Status: AC
  Administered 2020-09-28: 4 g via INTRAVENOUS
  Filled 2020-09-28: qty 1000

## 2020-09-28 MED ORDER — FENTANYL CITRATE (PF) 100 MCG/2ML IJ SOLN
INTRAMUSCULAR | Status: DC | PRN
  Administered 2020-09-28 (×2): 50 ug via INTRAVENOUS

## 2020-09-28 SURGICAL SUPPLY — 31 items
CATH KIT ON-Q SILVERSOAK 5IN (CATHETERS) ×4 IMPLANT
DERMABOND ADVANCED (GAUZE/BANDAGES/DRESSINGS) ×1
DERMABOND ADVANCED .7 DNX12 (GAUZE/BANDAGES/DRESSINGS) ×1 IMPLANT
DRSG OPSITE POSTOP 4X10 (GAUZE/BANDAGES/DRESSINGS) ×2 IMPLANT
DRSG TELFA 3X8 NADH (GAUZE/BANDAGES/DRESSINGS) ×2 IMPLANT
ELECT CAUTERY BLADE 6.4 (BLADE) ×2 IMPLANT
ELECT REM PT RETURN 9FT ADLT (ELECTROSURGICAL) ×2
ELECTRODE REM PT RTRN 9FT ADLT (ELECTROSURGICAL) ×1 IMPLANT
GAUZE SPONGE 4X4 12PLY STRL (GAUZE/BANDAGES/DRESSINGS) ×2 IMPLANT
GLOVE SURG ENC MOIS LTX SZ7 (GLOVE) ×2 IMPLANT
GLOVE SURG UNDER LTX SZ7.5 (GLOVE) ×2 IMPLANT
GOWN STRL REUS W/ TWL LRG LVL3 (GOWN DISPOSABLE) ×3 IMPLANT
GOWN STRL REUS W/TWL LRG LVL3 (GOWN DISPOSABLE) ×3
MANIFOLD NEPTUNE II (INSTRUMENTS) ×2 IMPLANT
MAT PREVALON FULL STRYKER (MISCELLANEOUS) ×2 IMPLANT
NS IRRIG 1000ML POUR BTL (IV SOLUTION) ×2 IMPLANT
PACK C SECTION AR (MISCELLANEOUS) ×2 IMPLANT
PAD OB MATERNITY 4.3X12.25 (PERSONAL CARE ITEMS) ×4 IMPLANT
PAD PREP 24X41 OB/GYN DISP (PERSONAL CARE ITEMS) ×2 IMPLANT
PENCIL SMOKE EVACUATOR (MISCELLANEOUS) ×2 IMPLANT
SCRUB EXIDINE 4% CHG 4OZ (MISCELLANEOUS) ×2 IMPLANT
STRIP CLOSURE SKIN 1/2X4 (GAUZE/BANDAGES/DRESSINGS) ×2 IMPLANT
SUT MNCRL 4-0 (SUTURE) ×1
SUT MNCRL 4-0 27XMFL (SUTURE) ×1
SUT PDS AB 1 TP1 96 (SUTURE) ×2 IMPLANT
SUT PLAIN GUT 0 (SUTURE) IMPLANT
SUT VIC AB 0 CTX 36 (SUTURE) ×2
SUT VIC AB 0 CTX36XBRD ANBCTRL (SUTURE) ×2 IMPLANT
SUTURE MNCRL 4-0 27XMF (SUTURE) ×1 IMPLANT
SWABSTK COMLB BENZOIN TINCTURE (MISCELLANEOUS) ×2 IMPLANT
WATER STERILE IRR 500ML POUR (IV SOLUTION) ×2 IMPLANT

## 2020-09-28 SURGICAL SUPPLY — 33 items
CATH KIT ON-Q SILVERSOAK 5IN (CATHETERS) ×4 IMPLANT
DERMABOND ADVANCED (GAUZE/BANDAGES/DRESSINGS) ×1
DERMABOND ADVANCED .7 DNX12 (GAUZE/BANDAGES/DRESSINGS) ×1 IMPLANT
DRSG OPSITE POSTOP 4X10 (GAUZE/BANDAGES/DRESSINGS) ×2 IMPLANT
DRSG TELFA 3X8 NADH (GAUZE/BANDAGES/DRESSINGS) ×2 IMPLANT
ELECT CAUTERY BLADE 6.4 (BLADE) ×2 IMPLANT
ELECT REM PT RETURN 9FT ADLT (ELECTROSURGICAL) ×2
ELECTRODE REM PT RTRN 9FT ADLT (ELECTROSURGICAL) ×1 IMPLANT
GAUZE SPONGE 4X4 12PLY STRL (GAUZE/BANDAGES/DRESSINGS) ×2 IMPLANT
GLOVE SURG ENC MOIS LTX SZ7 (GLOVE) ×2 IMPLANT
GLOVE SURG UNDER LTX SZ7.5 (GLOVE) ×2 IMPLANT
GOWN STRL REUS W/ TWL LRG LVL3 (GOWN DISPOSABLE) ×3 IMPLANT
GOWN STRL REUS W/TWL LRG LVL3 (GOWN DISPOSABLE) ×3
MANIFOLD NEPTUNE II (INSTRUMENTS) ×2 IMPLANT
MAT PREVALON FULL STRYKER (MISCELLANEOUS) ×2 IMPLANT
NS IRRIG 1000ML POUR BTL (IV SOLUTION) ×2 IMPLANT
PACK C SECTION AR (MISCELLANEOUS) ×2 IMPLANT
PAD OB MATERNITY 4.3X12.25 (PERSONAL CARE ITEMS) ×4 IMPLANT
PAD PREP 24X41 OB/GYN DISP (PERSONAL CARE ITEMS) ×2 IMPLANT
PENCIL SMOKE EVACUATOR (MISCELLANEOUS) ×2 IMPLANT
RETRACTOR TRAXI PANNICULUS (MISCELLANEOUS) ×1 IMPLANT
SCRUB EXIDINE 4% CHG 4OZ (MISCELLANEOUS) ×2 IMPLANT
STRIP CLOSURE SKIN 1/2X4 (GAUZE/BANDAGES/DRESSINGS) ×2 IMPLANT
SUT MNCRL 4-0 (SUTURE) ×1
SUT MNCRL 4-0 27XMFL (SUTURE) ×1
SUT PDS AB 1 TP1 96 (SUTURE) ×2 IMPLANT
SUT PLAIN GUT 0 (SUTURE) IMPLANT
SUT VIC AB 0 CTX 36 (SUTURE) ×2
SUT VIC AB 0 CTX36XBRD ANBCTRL (SUTURE) ×2 IMPLANT
SUTURE MNCRL 4-0 27XMF (SUTURE) ×1 IMPLANT
SWABSTK COMLB BENZOIN TINCTURE (MISCELLANEOUS) ×2 IMPLANT
TRAXI PANNICULUS RETRACTOR (MISCELLANEOUS) ×1
WATER STERILE IRR 500ML POUR (IV SOLUTION) ×2 IMPLANT

## 2020-09-28 NOTE — Anesthesia Postprocedure Evaluation (Signed)
Anesthesia Post Note  Patient: Tara Chavez  Procedure(s) Performed: CESAREAN SECTION  Patient location during evaluation: PACU Anesthesia Type: Spinal Level of consciousness: oriented and awake and alert Pain management: pain level controlled Vital Signs Assessment: post-procedure vital signs reviewed and stable Respiratory status: spontaneous breathing, respiratory function stable and patient connected to nasal cannula oxygen Cardiovascular status: blood pressure returned to baseline and stable Postop Assessment: no headache, no backache and no apparent nausea or vomiting Anesthetic complications: no   No notable events documented.   Last Vitals:  Vitals:   09/28/20 0918 09/28/20 0919  BP:    Pulse: 68 64  Resp: 18 (!) 22  Temp:    SpO2: 95% 96%    Last Pain:  Vitals:   09/28/20 0911  TempSrc: Oral  PainSc:                  Yevette Edwards

## 2020-09-28 NOTE — Transfer of Care (Signed)
Immediate Anesthesia Transfer of Care Note  Patient: Tara Chavez  Procedure(s) Performed: CESAREAN SECTION  Patient Location: LDR 2  Anesthesia Type:Spinal  Level of Consciousness: awake, alert  and oriented  Airway & Oxygen Therapy: Patient Spontanous Breathing  Post-op Assessment: Report given to RN and Post -op Vital signs reviewed and stable  Post vital signs: Reviewed and stable  Last Vitals:  Vitals Value Taken Time  BP 108/64 09/28/20 0742  Temp 36.4 C 09/28/20 0742  Pulse 57 09/28/20 0743  Resp 14 09/28/20 0743  SpO2 96 % 09/28/20 0743  Vitals shown include unvalidated device data.  Last Pain:  Vitals:   09/28/20 0133  TempSrc: Oral  PainSc:          Complications: No notable events documented.

## 2020-09-28 NOTE — OB Triage Note (Signed)
Pt presents to unit c/o ctx q 5 minutes for the past 4 hours. Pt reports ctx starting in the am and progressively getting stronger throughout the day. Pt also reports having some spotting earlier that has resolved. Pt reports +FM, denies LOF. Pt is scheduled for a c/s on 8/30.

## 2020-09-28 NOTE — H&P (Signed)
Tara Chavez is a 40 y.o. female  at 88 weeks 3 days gestation with a High Risk pregnancy,presenting for evaluation of labor. Her history is noted for two previous Cesarean sections, and she planned on a repeat CS  tomorrow with a BTL.Yesterday she began having irregular contractions which progressed to more painful and regular ucs this evening. She denies any LOF or vaginal bleeding. Her baby has been moving well.  Her prenatal course has been marked by Gestational diabetes, AMA, Obesity, and the two previous Cesareans. 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 Social History:  reports that she has quit smoking. She has never used smokeless tobacco. She reports that she does not currently use alcohol. She reports that she does not use drugs.     Maternal Diabetes: Yes:  Diabetes Type:  Insulin/Medication controlled- Metformin and diet control Genetic Screening: Declined Maternal Ultrasounds/Referrals: Normal Fetal Ultrasounds or other Referrals:  Referred to Materal Fetal Medicine  Maternal Substance Abuse:  No Significant Maternal Medications:  Meds include: Other:  Metformin Significant  Maternal Lab Results:  Group B Strep positive Other Comments:  None  Review of Systems  Constitutional: Negative.   HENT: Negative.    Eyes: Negative.   Respiratory: Negative.    Cardiovascular: Negative.   Gastrointestinal:        Gravid abdomen  Endocrine: Negative.   Genitourinary: Negative.   Musculoskeletal: Negative.   Allergic/Immunologic: Negative.   Neurological:  Positive for headaches.  Hematological: Negative.   Psychiatric/Behavioral: Negative.    History Dilation: Closed Effacement (%): Thick Station: Ballotable Exam by:: Eunice Blase CNM Blood pressure (!) 161/89, pulse 76, temperature 98.7 F (37.1 C), temperature source Oral, resp. rate 15, height 4' 7.12" (1.4 m), weight 83 kg, last menstrual period 12/06/2019, SpO2 98 %. Maternal Exam:  Uterine Assessment: Contraction strength is mild.  Contraction frequency is regular.  Abdomen: Estimated fetal weight is 7.5 lbs.   Fetal presentation: vertex Introitus: Normal vulva. Normal vagina.  Pelvis: of concern for delivery.   Cervix: Cervix evaluated by digital exam.    Physical Exam Constitutional:      Appearance: Normal appearance. She is obese.  HENT:     Head: Normocephalic and atraumatic.     Nose: Nose normal.  Eyes:     Extraocular Movements: Extraocular movements intact.     Pupils: Pupils are equal, round, and reactive  to light.  Cardiovascular:     Rate and Rhythm: Normal rate and regular rhythm.     Pulses: Normal pulses.     Heart sounds: Normal heart sounds.  Pulmonary:     Effort: Pulmonary effort is normal.     Breath sounds: Normal breath sounds.  Abdominal:     Palpations: Abdomen is soft.     Comments: Gravid uterus  Genitourinary:    General: Normal vulva.     Rectum: Normal.     Comments: SVE- closed/presenting part is out of the pelvis. Musculoskeletal:        General: Normal range of motion.     Cervical back: Normal range of motion and neck supple.  Skin:    General: Skin is warm  and dry.  Neurological:     General: No focal deficit present.     Mental Status: She is alert and oriented to person, place, and time.  Psychiatric:        Mood and Affect: Mood normal.        Behavior: Behavior normal.    Prenatal labs: ABO, Rh: O/Positive/-- (03/09 1053) Antibody: Negative (03/09 1053) Rubella:   RPR: Non Reactive (06/14 0835)  HBsAg: Negative (03/09 1053)  HIV:    GBS: Positive/-- (08/11 1030)   Assessment/Plan: IUP 39 weeks 3 days History of two Cesarean sections with plan for repeat and BTL- consents signed. Pre eclampsia of pregnancy- new diagnosis based on today's labs (UPC 500) Category 1 FHTS Gestational Diabetes Plan of Care discussed  with Dr. Jean Rosenthal-  Patient is admitted and will proceed with repeat C Section  Magnesium sulphate bolus started. Careful BP monitoring. Will continue with magnesium for 24 hours after delivery. Routine admission labs completed. EFM continuous until in the OR. Anesthesia notified.  Transfer of care to MD management from CNM.  Mirna Mires 09/28/2020, 5:00 AM

## 2020-09-28 NOTE — Anesthesia Preprocedure Evaluation (Signed)
Anesthesia Evaluation  Patient identified by MRN, date of birth, ID band Patient awake    Reviewed: Allergy & Precautions, H&P , NPO status , Patient's Chart, lab work & pertinent test results, reviewed documented beta blocker date and time   History of Anesthesia Complications Negative for: history of anesthetic complications  Airway Mallampati: III  TM Distance: >3 FB Neck ROM: full    Dental  (+) Dental Advidsory Given, Teeth Intact   Pulmonary neg pulmonary ROS, former smoker,    Pulmonary exam normal breath sounds clear to auscultation       Cardiovascular Exercise Tolerance: Good hypertension (Pre-eclampsia with severe range pressures), (-) angina(-) Past MI and (-) Cardiac Stents Normal cardiovascular exam(-) dysrhythmias (-) Valvular Problems/Murmurs Rhythm:regular Rate:Normal     Neuro/Psych  Headaches, neg Seizures PSYCHIATRIC DISORDERS    GI/Hepatic negative GI ROS, Neg liver ROS,   Endo/Other  diabetes, Gestational  Renal/GU negative Renal ROS  negative genitourinary   Musculoskeletal   Abdominal   Peds  Hematology negative hematology ROS (+)   Anesthesia Other Findings Past Medical History: No date: Gestational diabetes     Comment:  per pt 04/04/20, hx of gestational diabetes, none now No date: Headache No date: Hypertension     Comment:  04/04/20- pt denies hx of HTN 03/30/2009: LLQ abdominal mass     Comment:  [facility]  Neg U/S No date: Psychiatric disorder     Comment:  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"   Reproductive/Obstetrics (+) Pregnancy                             Anesthesia Physical Anesthesia Plan  ASA: 3  Anesthesia Plan: Spinal   Post-op Pain Management:    Induction: Intravenous  PONV Risk Score and Plan:   Airway Management Planned: Natural Airway and Simple Face Mask  Additional Equipment:   Intra-op Plan:   Post-operative Plan:  Extubation in OR  Informed Consent: I have reviewed the patients History and Physical, chart, labs and discussed the procedure including the risks, benefits and alternatives for the proposed anesthesia with the patient or authorized representative who has indicated his/her understanding and acceptance.     Dental Advisory Given  Plan Discussed with: Anesthesiologist, CRNA and Surgeon  Anesthesia Plan Comments:         Anesthesia Quick Evaluation

## 2020-09-28 NOTE — Progress Notes (Signed)
RN updated Tara Chavez,CNM with urinary output after LR bolus. Urinary output 32mL an hour. Tara Chavez gave verbal order to discontinue post partum Oxytocin infusion at 1700 instead of 2230 to help with the urinary output.

## 2020-09-28 NOTE — Lactation Note (Signed)
This note was copied from a baby's chart. Lactation Consultation Note  Patient Name: Tara Chavez [facility] Date: 09/28/2020 Reason for consult: Initial assessment;L&D Initial assessment;Term;Other (Comment) (GDM, gHTN, c-section) Age:42 hours  Lactation called to bedside to assist mom in LDR2 with feeding and teaching of hand expression.  In-person interpreter requested, and iPad interpreter currently in use in another room.  Baby was on R breast when LC entered, but coming on/off, crying out. LC attempted several times to assist with latch, reposition baby for attempt at better positioning- however mom at one point pushed [facility] hand away, however accepted help with getting baby to the opposite breast-  Mom prefers laid back/biological nursing position but baby appeared face down into the breast, uncomfortable, eventually the baby did latch and mom was able to say that it was strong and non-painful.  LC demonstrated tips to keep baby feeding and active until the end of feeding.  Transition RN was updated, and made aware that LC will return with either in-person interpreter (when available) or iPad interpreter (when available).  LC notified at 11:21 that In-person interpreter was present. LC back in room- re-introduced to mom. Mom reports that she is concerned baby is not getting enough, unable to see anything when she "squeezes".   LC offered to demonstrate and teach hand expression- mom agreed. Drops noted, light/clear consistency, and mom reassured. Baby did wake; mom attempted latching on her own, declining LC help at first. After several attempts mom agreed to [facility] support, and different position. Baby fell asleep in football hold, non-cuing and content. Mom educated on early cues, positioning, wide latch, not pulling back on breast tissue, and BF assistance available.  Encouraged mom to call with feedings as needed.  Maternal Data Has patient been taught Hand Expression?:   (attempted; pushed hand away) Does the patient have breastfeeding experience prior to this delivery?: Yes How long did the patient breastfeed?: unspecified  Feeding Mother's Current Feeding Choice: Breast Milk  LATCH Score Latch: Repeated attempts needed to sustain latch, nipple held in mouth throughout feeding, stimulation needed to elicit sucking reflex.  Audible Swallowing: A few with stimulation  Type of Nipple: Everted at rest and after stimulation  Comfort (Breast/Nipple): Soft / non-tender  Hold (Positioning): No assistance needed to correctly position infant at breast. (mom would not allow assist)  LATCH Score: 8   Lactation Tools Discussed/Used    Interventions Interventions: Skin to skin;Hand express  Discharge    Consult Status Consult Status: Follow-up Date: 09/28/20 Follow-up type: In-patient    Danford Bad 09/28/2020, 11:15 AM

## 2020-09-28 NOTE — Op Note (Addendum)
Cesarean Section Operative Note    Patient Name: Tara Chavez  MRN: [id number]  Date of Surgery: 09/28/2020   Pre-operative Diagnosis:  1) history of cesarean delivery, desires repeat 2) desires permanent sterilization 3) preeclampsia with severe features 4) intrauterine pregnancy at [redacted]w[redacted]d   Post-operative Diagnosis:  1) history of cesarean delivery, desires repeat 2) desires permanent sterilization 3) preeclampsia with severe features 4) intrauterine pregnancy at [redacted]w[redacted]d    Procedure:  1) Repeat low transverse cesarean section via Pfannenstiel incision with double layer uterine closure 2) bilateral tubal ligation via Barnett Abu method  Surgeon: Surgeon(s) and Role:    * Conard Novak, MD - Primary   Assistants: Paula Compton, CNM; No other capable assistant available, in surgery requiring high level assistant.  Anesthesia: spinal   Findings:  1) normal appearing gravid uterus, fallopian tubes, and ovaries 2) viable female infant with weight 3,680 grams (8 lbs 2 oz), APGARs 8 and 9   Quantified Blood Loss: 465 mL  Total IV Fluids: 1,000 ml   Urine Output:  20 mL clear urine at end of procedure  Specimens: portion of right and left fallopian tubes  Complications: no complications  Disposition: PACU - hemodynamically stable.   Maternal Condition: stable   Baby condition / location:  Couplet care / Skin to Skin  Procedure Details:  The patient was seen in the Holding Room. The risks, benefits, complications, treatment options, and expected outcomes were discussed with the patient. The patient concurred with the proposed plan, giving informed consent. identified as Tara Chavez and the procedure verified as C-Section Delivery. A Time Out was held and the above information confirmed.   After induction of anesthesia, the patient was draped and prepped in the usual sterile manner. A Pfannenstiel incision was made and carried down through the  subcutaneous tissue to the fascia. Fascial incision was made and extended transversely. The fascia was separated from the underlying rectus tissue superiorly and inferiorly. The peritoneum was identified and entered. Peritoneal incision was extended longitudinally. The bladder flap was bluntly and sharply freed from the lower uterine segment. A low transverse uterine incision was made and the hysterotomy was extended with cranial-caudal tension. Delivered from cephalic presentation was a 3,680 gram Living newborn infant(s) or Female with Apgar scores of 8 at one minute and 9 at five minutes. Cord ph was not sent the umbilical cord was clamped and cut cord blood was obtained for evaluation. The placenta was removed Intact and appeared normal. The uterine outline, tubes and ovaries appeared normal. The uterine incision was closed with running locked sutures of 0 Vicryl.  A second layer of the same suture was thrown in an imbricating fashion.  Hemostasis was assured.    The tubal ligation portion of the procedure was performed at this point.  The left fallopian tube was identified and followed out to the fimbriated end.  A Babcock clamp was used to grasp the tube in the mid-isthmic portion and two 2-0 plain gut sutures were used to ligate the tube.  An approximately 3cm segment of tube was removed with hemostasis assured.  The same procedure was performed on the right fallopian tube with hemostasis noted.   The uterus was returned to the abdomen and the paracolic gutters were cleared of all clots and debris.  The rectus muscles were inspected and found to be hemostatic.  The On-Q catheter pumps were inserted in accordance with the manufacturer's recommendations.  The catheters were inserted approximately 4cm cephelad to the incision  line, approximately 1cm apart, straddling the midline.  They were inserted to a depth of the 4th mark. They were positioned superficial to the rectus abdominus muscles and deep to the  rectus fascia.    The fascia was then reapproximated with running sutures of 1-0 PDS, looped. The subcutaneous tissue was reapproximated using 2-0 plain gut such that no greater than 2cm of dead space remained. The subcuticular closure was performed using 4-0 monocryl. The skin closure was reinforced using surgical skin glue.  The On-Q catheters were bolused with 5 mL of 0.5% marcaine plain for a total of 10 mL.  The catheters were affixed to the skin with surgical skin glue, steri-strips, and tegaderm.    The surgical assistant performed tissue retraction, assistance with suturing, and fundal pressure.    Instrument, sponge, and needle counts were correct prior the abdominal closure and were correct at the conclusion of the case.  The patient received Ancef 2 gram IV prior to skin incision (within 30 minutes). For VTE prophylaxis she was wearing SCDs throughout the case.  The assistant surgeon was a CNM due to lack of availability of another Sales promotion account executive.   Signed: Conard Novak, MD 09/28/2020 7:23 AM

## 2020-09-28 NOTE — Anesthesia Procedure Notes (Signed)
Spinal  Patient location during procedure: OR Start time: 09/28/2020 6:01 AM End time: 09/28/2020 6:02 AM Reason for block: surgical anesthesia Staffing Performed: resident/CRNA  Anesthesiologist: Martha Clan, MD Resident/CRNA: Mina Marble D, CRNA Preanesthetic Checklist Completed: patient identified, IV checked, site marked, risks and benefits discussed, surgical consent, monitors and equipment checked, pre-op evaluation and timeout performed Spinal Block Patient position: sitting Prep: ChloraPrep Patient monitoring: heart rate, continuous pulse ox, blood pressure and cardiac monitor Approach: midline Location: L3-4 Injection technique: single-shot Needle Needle type: Whitacre and Introducer  Needle gauge: 24 G Needle length: 9 cm Assessment Sensory level: T4 Events: CSF return Additional Notes Negative paresthesia. Negative blood return. Positive free-flowing CSF. Expiration date of kit checked and confirmed. Patient tolerated procedure well, without complications.

## 2020-09-28 NOTE — Discharge Summary (Signed)
Postpartum Discharge Summary  Patient Name: Tara Chavez DOB: 1980-06-17 MRN: [id number]  Date of admission: 09/28/2020 Delivery date:09/28/2020  Delivering provider: Prentice Docker D  Date of discharge: 09/30/2020  Admitting diagnosis: Irregular uterine contractions [O47.9] History of cesarean section [Z98.891] Intrauterine pregnancy: [redacted]w[redacted]d    Secondary diagnosis:  Principal Problem:   Supervision of high risk pregnancy, antepartum Active Problems:   Previous cesarean section x2: 05/02/12 LTCS & 2016   Advanced maternal age in multigravida 40yo   Obesity complicating pregnancy BPRF=16.3  Gestational diabetes mellitus (GDM) during puerperium controlled on oral hypoglycemic therapy   Irregular uterine contractions   Preeclampsia, severe, third trimester   [redacted] weeks gestation of pregnancy   Encounter for sterilization   History of cesarean section  Additional problems: none    Discharge diagnosis: Term Pregnancy Delivered and Preeclampsia (severe)                                              Post partum procedures: none Augmentation: N/A Complications: None  Hospital course: The patient presented with contractions and was found to have severe-range blood pressures and proteinuria. Given her history of cesarean delivery and desire for repeat along with desire for sterilization, she was taken to the OR where she underwent repeat cesarean delivery and bilateral tubal ligation. This occurred without incident. She had been started on magnesium sulfate and was maintained on this for 24 hours postpartum.  Her postpartum course was significant for normal blood pressures and some elevated glucose readings which have been addressed with Metformin.  She is discharged home on postoperative day #2 with plans for [facility]  Magnesium Sulfate received: Yes: Seizure prophylaxis BMZ received: No Rhophylac:N/A MMR:Yes T-DaP:Given  prenatally Transfusion:No  Physical exam  Vitals:   09/29/20 1938 09/29/20 2307 09/30/20 0332 09/30/20 0759  BP: 125/76 135/83 133/80 136/85  Pulse: 75 81 78 69  Resp: 20 18 18 20   Temp: 98.8 F (37.1 C) 98.5 F (36.9 C) 98.8 F (37.1 C) 98.9 F (37.2 C)  TempSrc: Oral Oral Oral Oral  SpO2: 96% 96% 97% 98%  Weight:      Height:       General: alert, cooperative, and no distress Lochia: appropriate Uterine Fundus: firm Incision: Healing well with no significant drainage DVT Evaluation: No evidence of DVT seen on physical exam. Negative Homan's sign. Labs: Lab Results  Component Value Date   WBC 9.0 09/29/2020   HGB 10.2 (L) 09/29/2020   HCT 30.0 (L) 09/29/2020   MCV 92.0 09/29/2020   PLT 169 09/29/2020   CMP Latest Ref Rng & Units 09/28/2020  Glucose 70 - 99 mg/dL 145(H)  BUN 6 - 20 mg/dL 14  Creatinine 0.44 - 1.00 mg/dL 0.59  Sodium 135 - 145 mmol/L 134(L)  Potassium 3.5 - 5.1 mmol/L 3.7  Chloride 98 - 111 mmol/L 108  CO2 22 - 32 mmol/L 20(L)  Calcium 8.9 - 10.3 mg/dL 8.5(L)  Total Protein 6.5 - 8.1 g/dL 6.2(L)  Total Bilirubin 0.3 - 1.2 mg/dL 0.6  Alkaline Phos 38 - 126 U/L 189(H)  AST 15 - 41 U/L 29  ALT 0 - 44 U/L 17   Edinburgh Score: Edinburgh Postnatal Depression Scale Screening Tool 09/30/2020  I have been able to laugh and see the funny side of things. 0  I  have looked forward with enjoyment to things. 0  I have blamed myself unnecessarily when things went wrong. 2  I have been anxious or worried for no good reason. 1  I have felt scared or panicky for no good reason. 0  Things have been getting on top of me. 1  I have been so unhappy that I have had difficulty sleeping. 0  I have felt sad or miserable. 1  I have been so unhappy that I have been crying. 1  The thought of harming myself has occurred to me. 0  Edinburgh Postnatal Depression Scale Total 6      After visit meds:  Allergies as of 09/30/2020   No Known Allergies      Medication  List     STOP taking these medications    blood glucose meter kit and supplies Kit   BLOOD GLUCOSE TEST STRIPS Strp       TAKE these medications    ibuprofen 600 MG tablet Commonly known as: ADVIL Take 1 tablet (600 mg total) by mouth every 6 (six) hours.   metFORMIN 500 MG tablet Commonly known as: Glucophage Take 1 tablet (500 mg total) by mouth 2 (two) times daily with a meal.   oxyCODONE 5 MG immediate release tablet Commonly known as: Oxy IR/ROXICODONE Take 1 tablet (5 mg total) by mouth every 4 (four) hours as needed for breakthrough pain or severe pain.   Prenatal Vitamin 27-0.8 MG Tabs Take 1 tablet by mouth daily.               Discharge Care Instructions  (From admission, onward)           Start     Ordered   09/30/20 0000  Leave dressing on - Keep it clean, dry, and intact until clinic visit        09/30/20 1212             Discharge home in stable condition Infant Feeding: Bottle and Breast Infant Disposition:home with mother Discharge instruction: per After Visit Summary and Postpartum booklet. Activity: Advance as tolerated. Pelvic rest for 6 weeks.  Diet: routine diet Anticipated Birth Control:  BTL performed during c-section Postpartum Appointment:6 weeks Additional Postpartum F/U: Incision check 1 week Future Appointments: Future Appointments  Date Time Provider [place]  10/01/2020 11:10 AM Schuman, Stefanie Libel, MD [facility] None   Follow up Visit:  Follow-up Information     Will Bonnet, MD. Schedule an appointment as soon as possible for a visit in 1 week(s).   Specialty: Obstetrics and Gynecology Why: For incision check Contact information: [address] [place] [address] [phone]                 SIGNED: Imagene Riches CNM

## 2020-09-28 NOTE — Progress Notes (Signed)
RN made Tresea Mall, CNM aware of the low urinary output. Urinary output 30-41mL per hour. Tresea Mall, CNM gave verbal order for a  LR bolus infusing at 1101. RN to update CNM with urinary output.

## 2020-09-29 ENCOUNTER — Inpatient Hospital Stay: Admission: RE | Admit: 2020-09-29 | Payer: Self-pay | Source: Home / Self Care | Admitting: Obstetrics and Gynecology

## 2020-09-29 LAB — CBC
HCT: 30 % — ABNORMAL LOW (ref 36.0–46.0)
Hemoglobin: 10.2 g/dL — ABNORMAL LOW (ref 12.0–15.0)
MCH: 31.3 pg (ref 26.0–34.0)
MCHC: 34 g/dL (ref 30.0–36.0)
MCV: 92 fL (ref 80.0–100.0)
Platelets: 169 10*3/uL (ref 150–400)
RBC: 3.26 MIL/uL — ABNORMAL LOW (ref 3.87–5.11)
RDW: 13.9 % (ref 11.5–15.5)
WBC: 9 10*3/uL (ref 4.0–10.5)
nRBC: 0 % (ref 0.0–0.2)

## 2020-09-29 LAB — SURGICAL PATHOLOGY

## 2020-09-29 LAB — GLUCOSE, CAPILLARY: Glucose-Capillary: 131 mg/dL — ABNORMAL HIGH (ref 70–99)

## 2020-09-29 LAB — RUBELLA ANTIBODY, IGM: Rubella IgM: <20 AU/mL (ref 0.0–19.9)

## 2020-09-29 LAB — RPR: RPR Ser Ql: NONREACTIVE

## 2020-09-29 MED ORDER — OXYCODONE HCL 5 MG PO TABS
5.0000 mg | ORAL_TABLET | ORAL | Status: DC | PRN
Start: 2020-09-29 — End: 2020-09-30
  Administered 2020-09-29 – 2020-09-30 (×3): 5 mg via ORAL
  Filled 2020-09-29 (×3): qty 1

## 2020-09-29 MED ORDER — OXYCODONE-ACETAMINOPHEN 5-325 MG PO TABS: 2.0000 | ORAL_TABLET | ORAL | Status: DC | PRN

## 2020-09-29 MED ORDER — LACTATED RINGERS IV SOLN: INTRAVENOUS | Status: DC

## 2020-09-29 MED ORDER — OXYCODONE-ACETAMINOPHEN 5-325 MG PO TABS
1.0000 | ORAL_TABLET | ORAL | Status: DC | PRN
Start: 2020-09-29 — End: 2020-09-30

## 2020-09-29 MED ORDER — LACTATED RINGERS IV SOLN
Freq: Once | INTRAVENOUS | Status: DC
Start: 2020-09-29 — End: 2020-09-29

## 2020-09-29 MED ORDER — METFORMIN HCL 500 MG PO TABS
500.0000 mg | ORAL_TABLET | Freq: Two times a day (BID) | ORAL | Status: DC
  Administered 2020-09-29 – 2020-09-30 (×3): 500 mg via ORAL
  Filled 2020-09-29 (×6): qty 1

## 2020-09-29 MED ORDER — IBUPROFEN 600 MG PO TABS
600.0000 mg | ORAL_TABLET | Freq: Four times a day (QID) | ORAL | Status: DC
  Administered 2020-09-29 – 2020-09-30 (×5): 600 mg via ORAL
  Filled 2020-09-29 (×5): qty 1

## 2020-09-29 NOTE — Lactation Note (Signed)
This note was copied from a baby's chart. Lactation Consultation Note  Patient Name: Tara Chavez MBTDH'R Date: 09/29/2020 Reason for consult: Follow-up assessment;Term;Other (Comment) (c-section) Age:42 hours  Lactation follow-up. Mom continues to breastfeed, however has started with formula. RN last night provided education on the impact this may have on the establishment of breastfeeding. Formula was given, total of 3x now, but baby has been back to breast.   Mom has requested pump to be set-up. With use of interpreter services on iPad, [phone], Mom was educated on set-up of the pump, use of the pump, cleaning of parts/pieces, and frequency. Mom still encouraged to put baby to breast for all feedings before providing supplement and before pumping.  Mom states she plans to continue breastfeeding. LC present for beginning of pumping session, mom states she is fine and doesn't need anything else.  Maternal Data Has patient been taught Hand Expression?: Yes Does the patient have breastfeeding experience prior to this delivery?: Yes How long did the patient breastfeed?: unspecified  Feeding Mother's Current Feeding Choice: Breast Milk and Formula  LATCH Score                    Lactation Tools Discussed/Used Tools: Pump Breast pump type: Double-Electric Breast Pump Pump Education: Setup, frequency, and cleaning;Milk Storage Reason for Pumping: Mom's choice Pumping frequency: q 3 hrs  Interventions Interventions: DEBP;Education  Discharge Pump: Manual  Consult Status Consult Status: Follow-up Date: 09/29/20 Follow-up type: Call as needed    Danford Bad 09/29/2020, 2:04 PM

## 2020-09-29 NOTE — Progress Notes (Signed)
Subjective:  Doing well, pain well controlled.  No further headaches.  Tolerating po.  Appropriate lochia  Objective:  Vital signs in last 24 hours: Temp:  [97.7 F (36.5 C)-98.5 F (36.9 C)] 98.2 F (36.8 C) (08/30 0800) Pulse Rate:  [57-99] 81 (08/30 0800) Resp:  [9-26] 16 (08/30 0800) BP: (100-153)/(60-88) 116/73 (08/30 0800) SpO2:  [91 %-99 %] 93 % (08/30 0800)    Intake/Output      08/29 0701 08/30 0700 08/30 0701 08/31 0700   P.O. 1000    I.V. (mL/kg) 2856.7 (34.4)    IV Piggyback 500    Total Intake(mL/kg) 4356.7 (52.5)    Urine (mL/kg/hr) 3370 (1.7) 150 (1)   Blood 581.7    Total Output 3951.7 150   Net +405 -150          General: NAD Pulmonary: no increased work of breathing Abdomen: non-distended, non-tender, fundus firm at level of umbilicus Incision: D/C/I Extremities: no edema, no erythema, no tenderness  Results for orders placed or performed during the hospital encounter of 09/28/20 (from the past 72 hour(s))  Resp Panel by RT-PCR (Flu A&B, Covid) Nasopharyngeal Swab     Status: None   Collection Time: 09/28/20  2:19 AM   Specimen: Nasopharyngeal Swab; Nasopharyngeal(NP) swabs in vial transport medium  Result Value Ref Range   SARS Coronavirus 2 by RT PCR NEGATIVE NEGATIVE    Comment: (NOTE) SARS-CoV-2 target nucleic acids are NOT DETECTED.  The SARS-CoV-2 RNA is generally detectable in upper respiratory specimens during the acute phase of infection. The lowest concentration of SARS-CoV-2 viral copies this assay can detect is 138 copies/mL. A negative result does not preclude SARS-Cov-2 infection and should not be used as the sole basis for treatment or other patient management decisions. A negative result may occur with  improper specimen collection/handling, submission of specimen other than nasopharyngeal swab, presence of viral mutation(s) within the areas targeted by this assay, and inadequate number of viral copies(<138 copies/mL). A  negative result must be combined with clinical observations, patient history, and epidemiological information. The expected result is Negative.  Fact Sheet for Patients:  BloggerCourse.com  Fact Sheet for Healthcare Providers:  SeriousBroker.it  This test is no t yet approved or cleared by the [place] FDA and  has been authorized for detection and/or diagnosis of SARS-CoV-2 by FDA under an Emergency Use Authorization (EUA). This EUA will remain  in effect (meaning this test can be used) for the duration of the COVID-19 declaration under Section 564(b)(1) of the Act, 21 U.S.C.section 360bbb-3(b)(1), unless the authorization is terminated  or revoked sooner.       Influenza A by PCR NEGATIVE NEGATIVE   Influenza B by PCR NEGATIVE NEGATIVE    Comment: (NOTE) The Xpert Xpress SARS-CoV-2/FLU/RSV plus assay is intended as an aid in the diagnosis of influenza from Nasopharyngeal swab specimens and should not be used as a sole basis for treatment. Nasal washings and aspirates are unacceptable for Xpert Xpress SARS-CoV-2/FLU/RSV testing.  Fact Sheet for Patients: BloggerCourse.com  Fact Sheet for Healthcare Providers: SeriousBroker.it  This test is not yet approved or cleared by the [place] FDA and has been authorized for detection and/or diagnosis of SARS-CoV-2 by FDA under an Emergency Use Authorization (EUA). This EUA will remain in effect (meaning this test can be used) for the duration of the COVID-19 declaration under Section 564(b)(1) of the Act, 21 U.S.C. section 360bbb-3(b)(1), unless the authorization is terminated or revoked.  Performed at [facility]  Lab, [address] [place], [place] [address]   Sample to Blood Bank     Status: None   Collection Time: 09/28/20  2:20 AM  Result Value Ref Range   Blood Bank Specimen SAMPLE AVAILABLE FOR TESTING     Sample Expiration      10/01/2020,2359 Performed at [facility], [address] [place], [place] [address]   CBC with Differential/Platelet     Status: Abnormal   Collection Time: 09/28/20  2:20 AM  Result Value Ref Range   WBC 6.5 4.0 - 10.5 K/uL   RBC 3.80 (L) 3.87 - 5.11 MIL/uL   Hemoglobin 12.1 12.0 - 15.0 g/dL   HCT 66.0 (L) 63.0 - 16.0 %   MCV 91.8 80.0 - 100.0 fL   MCH 31.8 26.0 - 34.0 pg   MCHC 34.7 30.0 - 36.0 g/dL   RDW 10.9 32.3 - 55.7 %   Platelets 192 150 - 400 K/uL   nRBC 0.0 0.0 - 0.2 %   Neutrophils Relative % 54 %   Neutro Abs 3.5 1.7 - 7.7 K/uL   Lymphocytes Relative 40 %   Lymphs Abs 2.6 0.7 - 4.0 K/uL   Monocytes Relative 5 %   Monocytes Absolute 0.3 0.1 - 1.0 K/uL   Eosinophils Relative 1 %   Eosinophils Absolute 0.1 0.0 - 0.5 K/uL   Basophils Relative 0 %   Basophils Absolute 0.0 0.0 - 0.1 K/uL   Immature Granulocytes 0 %   Abs Immature Granulocytes 0.02 0.00 - 0.07 K/uL    Comment: Performed at [facility], [address] [place], [place] [address]  Comprehensive metabolic panel     Status: Abnormal   Collection Time: 09/28/20  2:20 AM  Result Value Ref Range   Sodium 134 (L) 135 - 145 mmol/L   Potassium 3.7 3.5 - 5.1 mmol/L   Chloride 108 98 - 111 mmol/L   CO2 20 (L) 22 - 32 mmol/L   Glucose, Bld 145 (H) 70 - 99 mg/dL    Comment: Glucose reference range applies only to samples taken after fasting for at least 8 hours.   BUN 14 6 - 20 mg/dL   Creatinine, Ser 5.42 0.44 - 1.00 mg/dL   Calcium 8.5 (L) 8.9 - 10.3 mg/dL   Total Protein 6.2 (L) 6.5 - 8.1 g/dL   Albumin 3.0 (L) 3.5 - 5.0 g/dL   AST 29 15 - 41 U/L   ALT 17 0 - 44 U/L   Alkaline Phosphatase 189 (H) 38 - 126 U/L   Total Bilirubin 0.6 0.3 - 1.2 mg/dL   GFR, Estimated >70 >62 mL/min    Comment: (NOTE) Calculated using the CKD-EPI Creatinine Equation (2021)    Anion gap 6 5 - 15    Comment: Performed at [facility], [address], [place], [place]  [address]  Protein / creatinine ratio, urine     Status: Abnormal   Collection Time: 09/28/20  2:20 AM  Result Value Ref Range   Creatinine, Urine 51 mg/dL   Total Protein, Urine 26 mg/dL    Comment: NO NORMAL RANGE ESTABLISHED FOR THIS TEST   Protein Creatinine Ratio 0.51 (H) 0.00 - 0.15 mg/mg[Cre]    Comment: Performed at [facility], [address] [place], [place] [address]  Type and screen [facility]     Status: None   Collection Time: 09/28/20  2:20 AM  Result Value Ref Range   ABO/RH(D) O POS  Antibody Screen NEG    Sample Expiration      10/01/2020,2359 Performed at [facility], [address] [place], [place] [address]   RPR     Status: None   Collection Time: 09/28/20  2:20 AM  Result Value Ref Range   RPR Ser Ql NON REACTIVE NON REACTIVE    Comment: Performed at [facility], [address] [place], [place] [address]  ABO/Rh     Status: None   Collection Time: 09/28/20  5:02 AM  Result Value Ref Range   ABO/RH(D)      O POS Performed at [facility], [address] [place], [place] [address]   CBC     Status: Abnormal   Collection Time: 09/28/20  8:05 AM  Result Value Ref Range   WBC 8.0 4.0 - 10.5 K/uL   RBC 3.67 (L) 3.87 - 5.11 MIL/uL   Hemoglobin 11.4 (L) 12.0 - 15.0 g/dL   HCT 28.434.0 (L) 13.236.0 - 44.046.0 %   MCV 92.6 80.0 - 100.0 fL   MCH 31.1 26.0 - 34.0 pg   MCHC 33.5 30.0 - 36.0 g/dL   RDW 10.213.6 72.511.5 - 36.615.5 %   Platelets 186 150 - 400 K/uL   nRBC 0.0 0.0 - 0.2 %    Comment: Performed at [facility], [address] [place], [place] [address]  Rapid HIV screen (HIV 1/2 Ab+Ag)     Status: None   Collection Time: 09/28/20  8:05 AM  Result Value Ref Range   HIV-1 P24 Antigen - HIV24 NON REACTIVE NON REACTIVE    Comment: (NOTE) Detection of p24 may be inhibited by biotin in the sample, causing false negative results in acute infection.    HIV 1/2 Antibodies NON REACTIVE NON REACTIVE    Interpretation (HIV Ag Ab)      A non reactive test result means that HIV 1 or HIV 2 antibodies and HIV 1 p24 antigen were not detected in the specimen.    Comment: Performed at [facility], [address] [place], [place] [address]  Type and screen [facility]     Status: None   Collection Time: 09/28/20  8:05 AM  Result Value Ref Range   ABO/RH(D) O POS    Antibody Screen NEG    Sample Expiration      10/01/2020,2359 Performed at [facility], [address] [place], [place] [address]   Glucose, capillary     Status: Abnormal   Collection Time: 09/28/20  8:07 AM  Result Value Ref Range   Glucose-Capillary 132 (H) 70 - 99 mg/dL    Comment: Glucose reference range applies only to samples taken after fasting for at least 8 hours.  Glucose, capillary     Status: Abnormal   Collection Time: 09/28/20  9:09 AM  Result Value Ref Range   Glucose-Capillary 116 (H) 70 - 99 mg/dL    Comment: Glucose reference range applies only to samples taken after fasting for at least 8 hours.  Magnesium     Status: Abnormal   Collection Time: 09/28/20 12:02 PM  Result Value Ref Range   Magnesium 4.5 (H) 1.7 - 2.4 mg/dL    Comment: Performed at [facility], [address] [place], [place] [address]  Glucose, capillary     Status: Abnormal   Collection Time: 09/28/20  5:24 PM  Result Value Ref Range   Glucose-Capillary 137 (H) 70 - 99 mg/dL  Comment: Glucose reference range applies only to samples taken after fasting for at least 8 hours.  Glucose, capillary     Status: Abnormal   Collection Time: 09/29/20  5:09 AM  Result Value Ref Range   Glucose-Capillary 131 (H) 70 - 99 mg/dL    Comment: Glucose reference range applies only to samples taken after fasting for at least 8 hours.  CBC     Status: Abnormal   Collection Time: 09/29/20  7:24 AM  Result Value Ref Range   WBC 9.0 4.0 - 10.5 K/uL   RBC 3.26 (L) 3.87 - 5.11 MIL/uL   Hemoglobin 10.2  (L) 12.0 - 15.0 g/dL   HCT 30.8 (L) 65.7 - 84.6 %   MCV 92.0 80.0 - 100.0 fL   MCH 31.3 26.0 - 34.0 pg   MCHC 34.0 30.0 - 36.0 g/dL   RDW 96.2 95.2 - 84.1 %   Platelets 169 150 - 400 K/uL   nRBC 0.0 0.0 - 0.2 %    Comment: Performed at [facility], [address] [place], [place] [address]    Immunization History  Administered Date(s) Administered   Influenza-Unspecified 12/25/2013   Tdap 05/19/2014, 07/14/2020    Assessment:   40 y.o. G3P3003 postoperativeday # 1 RLTCS & BTL   Plan:  ) Acute blood loss anemia - hemodynamically stable and asymptomatic - po ferrous sulfate  2) Blood Type --/--/O POS (08/29 0805) / Rubella Immune / Varicella Immune  3) TDAP status up to date  4) Feeding plan breast  5)  GDM - continue metformin 500mg  po bid  6) Preeclampsia with severe features - status post 24-hrs of magnesium sulfate  7) Disposition POD 2-3  , MD, [facility], [facility] 09/29/2020, 8:48 AM

## 2020-09-30 MED ORDER — OXYCODONE HCL 5 MG PO TABS: 5.0000 mg | ORAL_TABLET | ORAL | 0 refills | Status: AC | PRN

## 2020-09-30 MED ORDER — IBUPROFEN 600 MG PO TABS: 600.0000 mg | ORAL_TABLET | Freq: Four times a day (QID) | ORAL | 0 refills | Status: AC

## 2020-09-30 NOTE — Lactation Note (Signed)
This note was copied from a baby's chart. Lactation Consultation Note  Patient Name: Tara Chavez RUEAV'W Date: 09/30/2020 Reason for consult: Follow-up assessment;Term;Other (Comment) (c-section) Age:42 hours  Lactation follow-up before discharge.  Mom pumped overnight, "small amounts", but notes that her nipples are sore.  Provided bigger flange options, and also indicated soreness could be related to her 3hr pump session yesterday. Encouraged to use coconut oil and bigger flange size. Set-up manual pump for mom, demonstrated use at bedside.  Encouraged to put baby to breast first, and use manual pump for more stimulation. Mom verbalized understanding. No other concerns/questions at this time.  [facility] referral sent for BF support, pump, possible formula need.  Maternal Data Has patient been taught Hand Expression?: Yes Does the patient have breastfeeding experience prior to this delivery?: Yes How long did the patient breastfeed?: unspecified  Feeding Mother's Current Feeding Choice: Breast Milk and Formula  LATCH Score                    Lactation Tools Discussed/Used Tools: Pump Breast pump type: Double-Electric Breast Pump  Interventions    Discharge Discharge Education: Outpatient recommendation;Warning signs for feeding baby;Engorgement and breast care Pump: Manual WIC Program: Yes  Consult Status Consult Status: Complete Date: 09/30/20 Follow-up type: Call as needed    Danford Bad 09/30/2020, 11:18 AM

## 2020-09-30 NOTE — Progress Notes (Signed)
Pt discharged with infant.  Discharge instructions, prescriptions and follow up appointment given to and reviewed with pt. Pt verbalized understanding. Escorted out by auxillary. 

## 2020-10-01 ENCOUNTER — Encounter: Payer: Self-pay | Admitting: Obstetrics and Gynecology

## 2020-10-09 ENCOUNTER — Ambulatory Visit (INDEPENDENT_AMBULATORY_CARE_PROVIDER_SITE_OTHER): Payer: Self-pay | Admitting: Obstetrics and Gynecology

## 2020-10-09 ENCOUNTER — Encounter: Payer: Self-pay | Admitting: Obstetrics and Gynecology

## 2020-10-09 ENCOUNTER — Other Ambulatory Visit: Payer: Self-pay

## 2020-10-09 VITALS — BP 126/84 | Wt 166.0 lb

## 2020-10-09 DIAGNOSIS — Z09 Encounter for follow-up examination after completed treatment for conditions other than malignant neoplasm: Secondary | ICD-10-CM

## 2020-10-09 NOTE — Progress Notes (Signed)
   Postoperative Follow-up Patient presents post op from cesarean section with tubal ligation  10days ago.  Subjective: She denies fever, chills, nausea, and vomiting. Eating a regular diet without difficulty. The patient is not having any pain.  Activity: increasing slowly. She denies issues with her incision.    Objective: BP 126/84   Wt 166 lb (75.3 kg)   BMI 38.42 kg/m   Physical Exam Constitutional:      General: She is not in acute distress.    Appearance: Normal appearance.  HENT:     Head: Normocephalic and atraumatic.  Eyes:     General: No scleral icterus.    Conjunctiva/sclera: Conjunctivae normal.  Abdominal:     General: There is no distension.     Palpations: Abdomen is soft. There is mass (uterus at U-3).     Tenderness: There is no abdominal tenderness. There is no guarding or rebound.     Comments: Incision: without erythema, induration, warmth, and tenderness. It is clean, dry, and intact.  Bandage removed without difficulty  Neurological:     General: No focal deficit present.     Mental Status: She is alert and oriented to person, place, and time.     Cranial Nerves: No cranial nerve deficit.  Psychiatric:        Mood and Affect: Mood normal.        Behavior: Behavior normal.        Judgment: Judgment normal.    Assessment: 40 y.o. s/p cesarean section with tubal ligation stable  Plan: Patient has done well after surgery with no apparent complications.  I have discussed the post-operative course to date, and the expected progress moving forward.  The patient understands what complications to be concerned about.    Activity plan: increase slowly.   Return in about 4 weeks (around 11/06/2020) for Six Week Postpartum.  Thomasene Mohair, MD 10/09/2020 12:06 PM

## 2020-11-05 ENCOUNTER — Ambulatory Visit (INDEPENDENT_AMBULATORY_CARE_PROVIDER_SITE_OTHER): Payer: Self-pay | Admitting: Obstetrics and Gynecology

## 2020-11-05 ENCOUNTER — Other Ambulatory Visit: Payer: Self-pay

## 2020-11-05 ENCOUNTER — Encounter: Payer: Self-pay | Admitting: Obstetrics and Gynecology

## 2020-11-05 DIAGNOSIS — O24439 Gestational diabetes mellitus in the puerperium, unspecified control: Secondary | ICD-10-CM

## 2020-11-05 NOTE — Progress Notes (Signed)
Postpartum Visit   Chief Complaint  Patient presents with   Postpartum Care    History of Present Illness: Patient is a 40 y.o. [id number] presents for postpartum visit.  Date of delivery: 09/28/2020 Type of delivery: C-Section-  Episiotomy No.  Laceration: no  Pregnancy or labor problems:  yes - GDM, AMA, Pre-eclampsia with severe features Any problems since the delivery:  no  Newborn Details:  SINGLETON :  1. Baby's name: . Birth weight: 8 lb 2 oz Maternal Details:  Breast Feeding:  yes Post partum depression/anxiety noted:  no Edinburgh Post-Partum Depression Score:  4  Date of last PAP: 04/08/2020  normal   Past Medical History:  Diagnosis Date   Gestational diabetes    per pt 04/04/20, hx of gestational diabetes, none now   Headache    Hypertension    04/04/20- pt denies hx of HTN   LLQ abdominal mass 03/30/2009   [facility]  Neg U/S   Psychiatric disorder    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"    Past Surgical History:  Procedure Laterality Date   CESAREAN SECTION     x2   CESAREAN SECTION  09/28/2020   Procedure: CESAREAN SECTION;  Surgeon: Conard Novak, MD;  Location: [facility] ORS;  Service: Obstetrics;;    Prior to Admission medications   Medication Sig Start Date End Date Taking? Authorizing Provider  metFORMIN (GLUCOPHAGE) 500 MG tablet Take 1 tablet (500 mg total) by mouth 2 (two) times daily with a meal. 09/03/20   Streilein, Annamarie, PA-C  Prenatal Vit-Fe Fumarate-FA (PRENATAL VITAMIN) 27-0.8 MG TABS Take 1 tablet by mouth daily. 09/24/20   Landry Dyke, PA-C    No Known Allergies   Social History   Socioeconomic History   Marital status: Married    Spouse name: Moises   Number of children: 2   Years of education: 11   Highest education level: 11th grade  Occupational History   Occupation: Education officer, environmental    Comment: Homemaker  Tobacco Use   Smoking status: Former   Smokeless tobacco: Never   Tobacco comments:    quit about 2011,  denies secondhand smoke  Vaping Use   Vaping Use: Never used  Substance and Sexual Activity   Alcohol use: Not Currently    Comment: "nothing in 10 years"   Drug use: Never   Sexual activity: Yes    Partners: Male    Birth control/protection: Surgical  Other Topics Concern   Not on file  Social History Narrative   Not on file   Social Determinants of Health   Financial Resource Strain: Low Risk    Difficulty of Paying Living Expenses: Not hard at all  Food Insecurity: No Food Insecurity   Worried About Programme researcher, broadcasting/film/video in the Last Year: Never true   Ran Out of Food in the Last Year: Never true  Transportation Needs: No Transportation Needs   Lack of Transportation (Medical): No   Lack of Transportation (Non-Medical): No  Physical Activity: Not on file  Stress: Not on file  Social Connections: Not on file  Intimate Partner Violence: Not At Risk   Fear of Current or Ex-Partner: No   Emotionally Abused: No   Physically Abused: No   Sexually Abused: No    Family History  Problem Relation Age of Onset   Heart disease Mother    Kidney disease Mother    Hypertension Mother    Hypertension Father    Angina Father  Migraines Sister    Multiple births Paternal Aunt        twins   Cancer Maternal Grandmother        unsure of kind-had surgery   Multiple births Paternal Aunt        triplets    Review of Systems  Constitutional: Negative.   HENT: Negative.    Eyes: Negative.   Respiratory: Negative.    Cardiovascular: Negative.   Gastrointestinal: Negative.   Genitourinary: Negative.   Musculoskeletal: Negative.   Skin: Negative.   Neurological: Negative.   Psychiatric/Behavioral: Negative.      Physical Exam BP 122/74   Wt 165 lb (74.8 kg)   BMI 38.19 kg/m   Physical Exam Constitutional:      General: She is not in acute distress.    Appearance: Normal appearance. She is well-developed.  HENT:     Head: Normocephalic and atraumatic.  Eyes:      General: No scleral icterus.    Conjunctiva/sclera: Conjunctivae normal.  Cardiovascular:     Rate and Rhythm: Normal rate and regular rhythm.     Heart sounds: No murmur heard.   No friction rub. No gallop.  Pulmonary:     Effort: Pulmonary effort is normal. No respiratory distress.     Breath sounds: Normal breath sounds. No wheezing or rales.  Abdominal:     General: Bowel sounds are normal. There is no distension.     Palpations: Abdomen is soft. There is no mass.     Tenderness: There is no abdominal tenderness. There is no guarding or rebound.     Comments: Incision: without erythema, induration, warmth, and tenderness. It is clean, dry, and intact.     Musculoskeletal:        General: Normal range of motion.     Cervical back: Normal range of motion and neck supple.  Neurological:     General: No focal deficit present.     Mental Status: She is alert and oriented to person, place, and time.     Cranial Nerves: No cranial nerve deficit.  Skin:    General: Skin is warm and dry.     Findings: No erythema.  Psychiatric:        Mood and Affect: Mood normal.        Behavior: Behavior normal.        Judgment: Judgment normal.     Female Chaperone present during breast and/or pelvic exam.  Assessment: 40 y.o. [id number] presenting for 6 week postpartum visit  Plan: Problem List Items Addressed This Visit   None Visit Diagnoses     Postpartum care and examination    -  Primary   Relevant Orders   Hemoglobin A1c   Gestational diabetes mellitus (GDM), postpartum       Relevant Orders   Hemoglobin A1c      1) Contraception: BTL during c-section.  2)  Pap - ASCCP guidelines and rational discussed.  Patient opts for routine screening interval. Up to date today  3) Patient underwent screening for postpartum depression with no concerns noted.  4) GDM: hemoglobin A1c today. If abnormal, repeat in 3 months.   5) Follow up 1 year for routine annual exam  Thomasene Mohair,  MD 11/05/2020 11:25 AM

## 2020-11-06 LAB — HEMOGLOBIN A1C
Est. average glucose Bld gHb Est-mCnc: 108 mg/dL
Hgb A1c MFr Bld: 5.4 % (ref 4.8–5.6)

## 2021-09-30 ENCOUNTER — Emergency Department
Admission: EM | Admit: 2021-09-30 | Discharge: 2021-09-30 | Disposition: A | Discharge status: Home / Self Care | Payer: Self-pay | Attending: Emergency Medicine | Admitting: Emergency Medicine

## 2021-09-30 ENCOUNTER — Other Ambulatory Visit: Payer: Self-pay

## 2021-09-30 DIAGNOSIS — W57XXXA Bitten or stung by nonvenomous insect and other nonvenomous arthropods, initial encounter: Secondary | ICD-10-CM | POA: Insufficient documentation

## 2021-09-30 DIAGNOSIS — L02419 Cutaneous abscess of limb, unspecified: Secondary | ICD-10-CM

## 2021-09-30 DIAGNOSIS — I10 Essential (primary) hypertension: Secondary | ICD-10-CM | POA: Insufficient documentation

## 2021-09-30 DIAGNOSIS — L03116 Cellulitis of left lower limb: Secondary | ICD-10-CM | POA: Insufficient documentation

## 2021-09-30 MED ORDER — SULFAMETHOXAZOLE-TRIMETHOPRIM 800-160 MG PO TABS: 1.0000 | ORAL_TABLET | Freq: Two times a day (BID) | ORAL | 0 refills | Status: AC

## 2021-09-30 MED ORDER — LIDOCAINE-EPINEPHRINE 2 %-1:100000 IJ SOLN
20.0000 mL | Freq: Once | INTRAMUSCULAR | Status: AC
  Administered 2021-09-30: 3 mL
  Filled 2021-09-30: qty 1

## 2021-09-30 MED ORDER — SULFAMETHOXAZOLE-TRIMETHOPRIM 800-160 MG PO TABS
1.0000 | ORAL_TABLET | Freq: Once | ORAL | Status: AC
  Administered 2021-09-30: 1 via ORAL
  Filled 2021-09-30: qty 1

## 2021-09-30 NOTE — ED Provider Notes (Signed)
[facility] Provider Note    Event Date/Time   First MD Initiated Contact with Patient 09/30/21 1135     (approximate)   History   Insect Bite   HPI  Tara Chavez is a 41 y.o. female  with pmh depression and hypertension who presents with concern for insect bite on the left lower extremity.  Patient noticed this several days ago.  Thinks she got bit by something.  She is having pain there is some white drainage.  No fevers or chills.  Did not actually see any insect or spider bite her.     Past Medical History:  Diagnosis Date   Gestational diabetes    per pt 04/04/20, hx of gestational diabetes, none now   Headache    Hypertension    04/04/20- pt denies hx of HTN   LLQ abdominal mass 03/30/2009   [facility]  Neg U/S   Psychiatric disorder    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"    Patient Active Problem List   Diagnosis Date Noted   Irregular uterine contractions 09/28/2020   Preeclampsia, severe, third trimester 09/28/2020   [redacted] weeks gestation of pregnancy 09/28/2020   Encounter for sterilization 09/28/2020   History of cesarean section 09/28/2020   Positive GBS test 09/15/2020   Gestational diabetes mellitus (GDM) during puerperium controlled on oral hypoglycemic therapy 07/17/2020   Abnormal quad screen 04/08/20 04/24/2020   Supervision of high risk pregnancy, antepartum 04/08/2020   Previous cesarean section x2: 05/02/12 LTCS & 2016 04/08/2020   Advanced maternal age in multigravida 41 yo 04/08/2020   Obesity complicating pregnancy BMI=32.7 04/08/2020   Poor historian 04/08/2020   PP hemorrhage  05/21/12 with EBL: 800 cc 04/08/2020   Family history of congenital anomaly (nephew born with heart defect) 04/08/2020   Short stature 4'11" 04/08/2020     Physical Exam  Triage Vital Signs: ED Triage Vitals  Enc Vitals Group     BP 09/30/21 1138 122/78     Pulse Rate 09/30/21 1138 68     Resp 09/30/21 1138 16     Temp  09/30/21 1138 98.5 F (36.9 C)     Temp Source 09/30/21 1138 Oral     SpO2 09/30/21 1138 96 %     Weight 09/30/21 1138 150 lb (68 kg)     Height 09/30/21 1138 4\' 6"  (1.372 m)     Head Circumference --      Peak Flow --      Pain Score 09/30/21 1129 4     Pain Loc --      Pain Edu? --      Excl. in GC? --     Most recent vital signs: Vitals:   09/30/21 1138  BP: 122/78  Pulse: 68  Resp: 16  Temp: 98.5 F (36.9 C)  SpO2: 96%     General: Awake, no distress.  CV:  Good peripheral perfusion.  Resp:  Normal effort.  Abd:  No distention.  Neuro:             Awake, Alert, Oriented x 3  Other:  There is approximately 2 x 2 centimeter area of erythema with central area of drainage and fluctuance on the left lateral lower leg   ED Results / Procedures / Treatments  Labs (all labs ordered are listed, but only abnormal results are displayed) Labs Reviewed - No data to display   EKG     RADIOLOGY Bedside ultrasound of  the left lower extremity shows small abscess pocket   PROCEDURES:  Critical Care performed: No  ..Incision and Drainage  Date/Time: 09/30/2021 12:54 PM  Performed by: Georga Hacking, MD Authorized by: Georga Hacking, MD   Consent:    Consent obtained:  Verbal   Risks discussed:  Pain Universal protocol:    Patient identity confirmed:  Verbally with patient Location:    Type:  Abscess   Location:  Lower extremity   Lower extremity location:  Leg   Leg location:  L lower leg Pre-procedure details:    Skin preparation:  Povidone-iodine Sedation:    Sedation type:  None Anesthesia:    Anesthesia method:  Local infiltration   Local anesthetic:  Lidocaine 1% WITH epi Procedure type:    Complexity:  Simple Procedure details:    Ultrasound guidance: no     Needle aspiration: no     Incision types:  Stab incision   Incision depth:  Dermal   Drainage:  Purulent   Drainage amount:  Moderate   Wound treatment:  Wound left open   The  patient is on the cardiac monitor to evaluate for evidence of arrhythmia and/or significant heart rate changes.   MEDICATIONS ORDERED IN ED: Medications  lidocaine-EPINEPHrine (XYLOCAINE W/EPI) 2 %-1:100000 (with pres) injection 20 mL (3 mLs Infiltration Given 09/30/21 1252)  sulfamethoxazole-trimethoprim (BACTRIM DS) 800-160 MG per tablet 1 tablet (1 tablet Oral Given 09/30/21 1251)     IMPRESSION / MDM / ASSESSMENT AND PLAN / ED COURSE  I reviewed the triage vital signs and the nursing notes.                              Patient's presentation is most consistent with acute complicated illness / injury requiring diagnostic workup.  Differential diagnosis includes, but is not limited to, abscess, folliculitis, cellulitis, infected insect bite  Patient is a 41 year old female presents with concern for an insect bite in the left lower extremity.  She appears to have a small abscess.  Will perform I&D.  Patient is currently breast-feeding so will use Bactrim.  Incision and drainage performed with return of moderate amount of pus.  Patient was recommended to take antibiotic for the next 5 days.  Also recommended warm soaks.  Discussed return precautions.     FINAL CLINICAL IMPRESSION(S) / ED DIAGNOSES   Final diagnoses:  Cellulitis and abscess of leg     Rx / DC Orders   ED Discharge Orders          Ordered    sulfamethoxazole-trimethoprim (BACTRIM DS) 800-160 MG tablet  2 times daily        09/30/21 1253             Note:  This document was prepared using Dragon voice recognition software and may include unintentional dictation errors.   Georga Hacking, MD 09/30/21 1255

## 2021-09-30 NOTE — ED Triage Notes (Signed)
Pt comes with c/o spider bite.

## 2021-09-30 NOTE — Discharge Instructions (Addendum)
Please take the antibiotic twice a day for the next 5 days.  Please do warm soaks over the area 3-4 times per day.  If the redness and swelling is increasing despite treatment please return to the emergency department.
